# Patient Record
Sex: Female | Born: 1955
Health system: Southern US, Community
[De-identification: ages and names within clinical notes are randomized; demographics above are authoritative.]

## PROBLEM LIST (undated history)

## (undated) DIAGNOSIS — I10 Essential (primary) hypertension: Secondary | ICD-10-CM

## (undated) HISTORY — DX: Essential (primary) hypertension: I10

---

## 1998-01-06 ENCOUNTER — Other Ambulatory Visit: Admission: RE | Admit: 1998-01-06 | Discharge: 1998-01-06 | Payer: Self-pay | Admitting: Obstetrics and Gynecology

## 1998-02-03 ENCOUNTER — Ambulatory Visit (HOSPITAL_COMMUNITY): Admission: RE | Admit: 1998-02-03 | Discharge: 1998-02-03 | Payer: Self-pay | Admitting: Obstetrics and Gynecology

## 1998-04-16 ENCOUNTER — Ambulatory Visit (HOSPITAL_COMMUNITY): Admission: RE | Admit: 1998-04-16 | Discharge: 1998-04-16 | Payer: Self-pay | Admitting: Obstetrics and Gynecology

## 1999-02-10 ENCOUNTER — Other Ambulatory Visit: Admission: RE | Admit: 1999-02-10 | Discharge: 1999-02-10 | Payer: Self-pay | Admitting: Obstetrics and Gynecology

## 2000-01-07 ENCOUNTER — Other Ambulatory Visit: Admission: RE | Admit: 2000-01-07 | Discharge: 2000-01-07 | Payer: Self-pay | Admitting: Obstetrics and Gynecology

## 2001-01-16 ENCOUNTER — Other Ambulatory Visit: Admission: RE | Admit: 2001-01-16 | Discharge: 2001-01-16 | Payer: Self-pay | Admitting: Obstetrics and Gynecology

## 2003-01-21 ENCOUNTER — Other Ambulatory Visit: Admission: RE | Admit: 2003-01-21 | Discharge: 2003-01-21 | Payer: Self-pay | Admitting: Obstetrics and Gynecology

## 2004-10-26 ENCOUNTER — Encounter: Admission: RE | Admit: 2004-10-26 | Discharge: 2004-10-26 | Payer: Self-pay | Admitting: Family Medicine

## 2008-03-04 ENCOUNTER — Encounter: Admission: RE | Admit: 2008-03-04 | Discharge: 2008-03-04 | Payer: Self-pay | Admitting: Family Medicine

## 2010-01-08 ENCOUNTER — Encounter: Admission: RE | Admit: 2010-01-08 | Discharge: 2010-01-08 | Payer: Self-pay | Admitting: Gastroenterology

## 2010-01-21 ENCOUNTER — Ambulatory Visit (HOSPITAL_COMMUNITY): Admission: RE | Admit: 2010-01-21 | Discharge: 2010-01-21 | Payer: Self-pay | Admitting: Gastroenterology

## 2015-09-16 ENCOUNTER — Other Ambulatory Visit: Payer: Self-pay | Admitting: Family Medicine

## 2015-09-16 ENCOUNTER — Ambulatory Visit
Admission: RE | Admit: 2015-09-16 | Discharge: 2015-09-16 | Disposition: A | Discharge status: Home / Self Care | Payer: BLUE CROSS/BLUE SHIELD | Source: Ambulatory Visit | Attending: Family Medicine | Admitting: Family Medicine

## 2015-09-16 DIAGNOSIS — G459 Transient cerebral ischemic attack, unspecified: Secondary | ICD-10-CM

## 2015-09-16 MED ORDER — GADOBENATE DIMEGLUMINE 529 MG/ML IV SOLN
11.0000 mL | Freq: Once | INTRAVENOUS | Status: AC | PRN
  Administered 2015-09-16: 11 mL via INTRAVENOUS

## 2015-10-31 ENCOUNTER — Encounter: Payer: Self-pay | Admitting: Neurology

## 2015-10-31 ENCOUNTER — Ambulatory Visit (INDEPENDENT_AMBULATORY_CARE_PROVIDER_SITE_OTHER): Payer: BLUE CROSS/BLUE SHIELD | Admitting: Neurology

## 2015-10-31 VITALS — BP 124/86 | HR 81 | Ht 65.5 in | Wt 137.1 lb

## 2015-10-31 DIAGNOSIS — I674 Hypertensive encephalopathy: Secondary | ICD-10-CM

## 2015-10-31 NOTE — Progress Notes (Signed)
Chart forwarded.  

## 2015-10-31 NOTE — Progress Notes (Signed)
NEUROLOGY CONSULTATION NOTE  Adonis HugueninJung S Valone MRN: [id number] DOB: October 11, 1955  Referring provider: Dr. Paulino RilyWolters Primary care provider: Dr. Paulino RilyWolters  Reason for consult:  TIA  HISTORY OF PRESENT ILLNESS: Summer Smith is a 60 year old woman with hypertension, hypercholesterolemia, and GERD who presents for transient ischemic attack.  History obtained by patient and PCP note.  On 09/10/15, she was reading her emails when she felt confused.  She noticed that her emails were in the wrong folders.  She reread an email by her son from 3 days prior and it seemed different.  The email described the things that needed to be done to prepare starting medical school, such as immunizations and background checks.  The content was correct, but the dates were missing and the order of things to be done were different.  She had her husband read the email and he verified what she was reading.  She denied headache, slurred speech, inability to understand language, focal numbness or weakness.  She checked her blood pressure, which was 185/145.  She took an extra Diovan.  When she looked at the emails again, the emails were again in different folders.  The systolic blood pressure went down to 145.  The next morning, she logged onto her emails and everything was back to how she remembered it.  The emails were in the correct folders.  Her son's email was exactly how she remembered it prior to the night before.  Her blood pressure is typically 140-145/90-100.  The only thing she recalls that could have elevated her blood pressure was having eaten 2 spicy chicken sandwiches, which have high sodium content.  MRI of brain with and without contrast from 09/16/15 was personally reviewed and was overall unremarkable except for very mild atrophy.  Labs from May include Na 138, K 4, glucose 102, BUN 14, Cr 0.71, TSH 1.25, cholesterol 243, HDL 60 and LDL 164.  On about 3 occasions, she had brief dizziness.  One time, she reached up to grab  her cat who was stuck up on the cabinet.  When she got down, she had brief dizziness for a couple of seconds, like a sense of movement.  She had a couple of other similar events with no associated headache or lateralizing symptoms.  Her blood pressure has since been controlled.  She is exercising and watching what she eats.  PAST MEDICAL HISTORY: Past Medical History  Diagnosis Date  . Hypertension     PAST SURGICAL HISTORY: History reviewed. No pertinent past surgical history.  MEDICATIONS: Diovan  ALLERGIES: No Known Allergies  FAMILY HISTORY: Father  Hypertension  SOCIAL HISTORY: Social History   Social History  . Marital Status: Single    Spouse Name: N/A  . Number of Children: N/A  . Years of Education: N/A   Occupational History  . Not on file.   Social History Main Topics  . Smoking status: Never Smoker   . Smokeless tobacco: Not on file  . Alcohol Use: Not on file  . Drug Use: Not on file  . Sexual Activity: Not on file   Other Topics Concern  . Not on file   Social History Narrative  . No narrative on file    REVIEW OF SYSTEMS: Constitutional: No fevers, chills, or sweats, no generalized fatigue, change in appetite Eyes: No visual changes, double vision, eye pain Ear, nose and throat: No hearing loss, ear pain, nasal congestion, sore throat Cardiovascular: No chest pain, palpitations Respiratory:  No shortness of  breath at rest or with exertion, wheezes GastrointestinaI: No nausea, vomiting, diarrhea, abdominal pain, fecal incontinence Genitourinary:  No dysuria, urinary retention or frequency Musculoskeletal:  No neck pain, back pain Integumentary: No rash, pruritus, skin lesions Neurological: as above Psychiatric: No depression, insomnia, anxiety Endocrine: No palpitations, fatigue, diaphoresis, mood swings, change in appetite, change in weight, increased thirst Hematologic/Lymphatic:  No purpura, petechiae. Allergic/Immunologic: no itchy/runny  eyes, nasal congestion, recent allergic reactions, rashes  PHYSICAL EXAM: Filed Vitals:   10/31/15 0756  BP: 124/86  Pulse: 81   General: No acute distress.  Patient appears well-groomed.  Head:  Normocephalic/atraumatic Eyes:  fundi examined but not visualized Neck: supple, no paraspinal tenderness, full range of motion Back: No paraspinal tenderness Heart: regular rate and rhythm Lungs: Clear to auscultation bilaterally. Vascular: No carotid bruits. Neurological Exam: Mental status: alert and oriented to person, place, and time, recent and remote memory intact, fund of knowledge intact, attention and concentration intact, speech fluent and not dysarthric, language intact. Cranial nerves: CN I: not tested CN II: pupils equal, round and reactive to light, visual fields intact CN III, IV, VI:  full range of motion, no nystagmus, no ptosis CN V: facial sensation intact CN VII: upper and lower face symmetric CN VIII: hearing intact CN IX, X: gag intact, uvula midline CN XI: sternocleidomastoid and trapezius muscles intact CN XII: tongue midline Bulk & Tone: normal, no fasciculations. Motor:  5/5 throughout Sensation: temperature and vibration sensation intact. Deep Tendon Reflexes:  2+ throughout, toes downgoing.  Finger to nose testing:  Without dysmetria.  Heel to shin:  Without dysmetria.  Gait:  Normal station and stride.  Able to turn and tandem walk. Romberg negative.  IMPRESSION: I suspect hypertensive encephalopathy.  Her blood pressure may have been even higher at some point.  Symptoms are non-lateralizing, which make a diagnosis of TIA not definite.  Dizziness is nonspecific but I don't suspect cerebrovascular etiology  The MRI reveals maybe slight premature atrophy.  I don't believe it is clinically significant and may be just how her brain is.  PLAN: Continue BP control Follow up as needed.  Thank you for allowing me to take part in the care of this  patient.  Shon MilletAdam Karlina Suares, DO  CC:  Mila PalmerSharon Wolters, MD

## 2015-10-31 NOTE — Patient Instructions (Signed)
I think the confusion was related to the high blood pressure.  I don't think you had a TIA.  I would just continue making sure your blood pressure is controlled.  The MRI of brain looks okay to me.  Follow up as needed.

## 2016-04-22 DIAGNOSIS — I10 Essential (primary) hypertension: Secondary | ICD-10-CM | POA: Insufficient documentation

## 2017-12-22 IMAGING — MR MR HEAD WO/W CM
12 series · 45 of 48 positions shown · IV contrast (11ml Multihance)
Comparison: None.

CLINICAL DATA: Transient cerebral ischemia consisting of difficulty
reading. This lasted for 10-15 minutes.

EXAM:
MRI HEAD WITHOUT AND WITH CONTRAST
TECHNIQUE: Multiplanar, multiecho pulse sequences of the brain and surrounding
structures were obtained without and with intravenous contrast.
CONTRAST:  11mL MULTIHANCE GADOBENATE DIMEGLUMINE 529 MG/ML IV SOLN
Creatinine was obtained on site at [HOSPITAL] at [HOSPITAL].
Results: Creatinine 0.7 mg/dL.

[Series 2: t1_se_sag · sagittal · 5.0mm · 0.45mm/px · 1 of 21 slices shown]
[im 1/21]
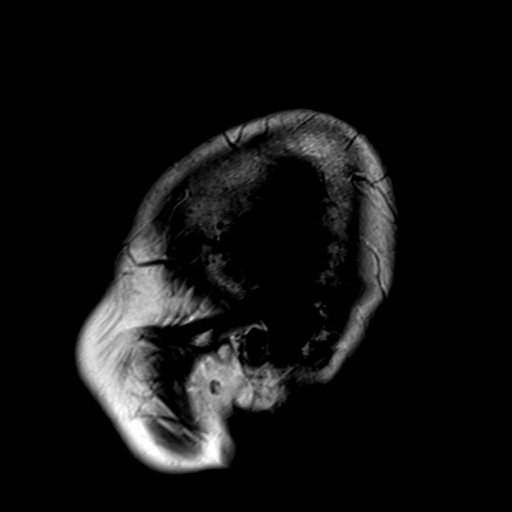

[Series 3: ep2d_diff_(id)_trace · axial · 3.0mm · 1.80mm/px · z∈[-56,+91]mm · 8 of 100 slices shown]
[im 1/100]
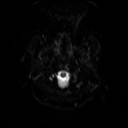
[im 15/100]
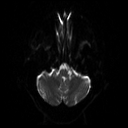
[im 29/100]
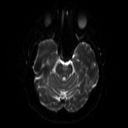
[im 43/100]
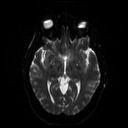
[im 57/100]
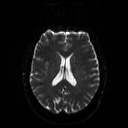
[im 71/100]
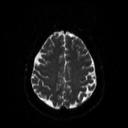
[im 85/100]
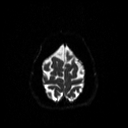
[im 100/100]
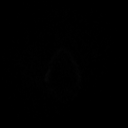

[Series 4: ep2d_diff_(id)_trace_adc · axial · 3.0mm · 1.80mm/px · z∈[-56,+91]mm · 4 of 50 slices shown]
[im 1/50]
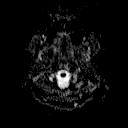
[im 17/50]
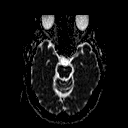
[im 33/50]
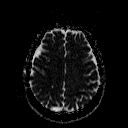
[im 50/50]
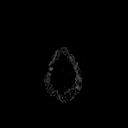

[Series 6: swi_images · axial · 2.0mm · 0.90mm/px · z∈[-55,+103]mm · 7 of 80 slices shown]
[im 1/80]
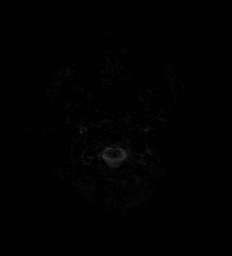
[im 14/80]
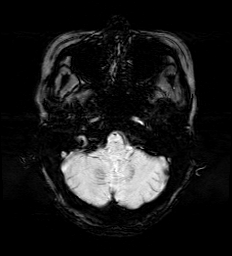
[im 27/80]
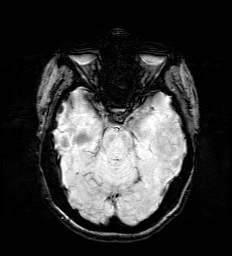
[im 40/80]
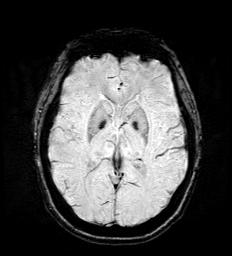
[im 53/80]
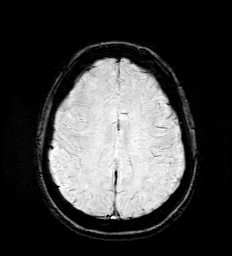
[im 66/80]
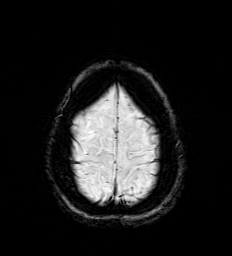
[im 80/80]
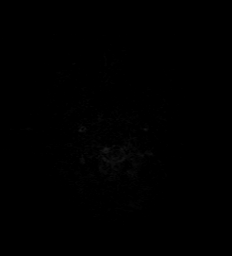

[Series 7: ep2d_diff_cor · coronal · 5.0mm · 1.77mm/px · 4 of 47 slices shown]
[im 1/47]
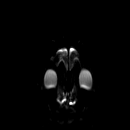
[im 16/47]
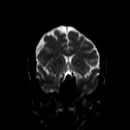
[im 31/47]
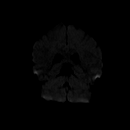
[im 47/47]
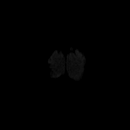

[Series 8: ep2d_diff_cor_adc · coronal · 5.0mm · 1.77mm/px · 2 of 24 slices shown]
[im 1/24]
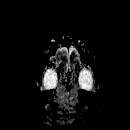
[im 24/24]
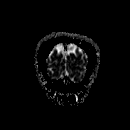

[Series 9: FLAIR · axial · 5.0mm · 0.45mm/px · z∈[-45,+93]mm · 2 of 23 slices shown]
[im 1/23]
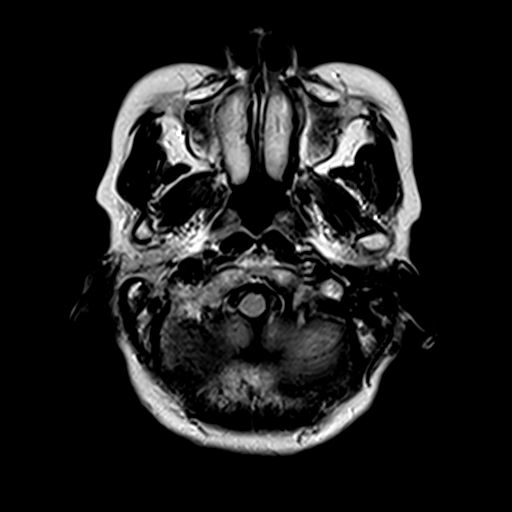
[im 23/23]
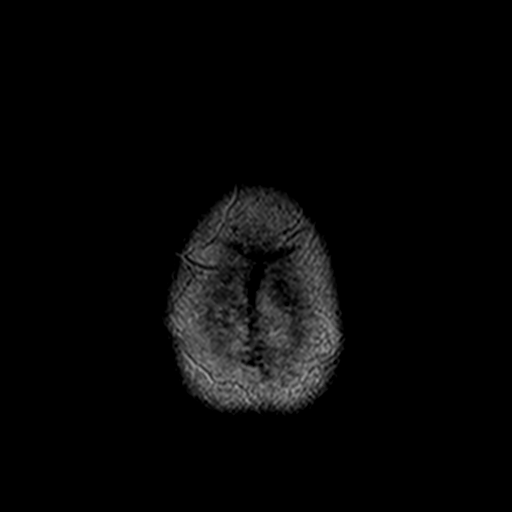

[Series 10: t2_tse_tra_512 · axial · 5.0mm · 0.60mm/px · z∈[-44,+93]mm · 2 of 23 slices shown]
[im 1/23]
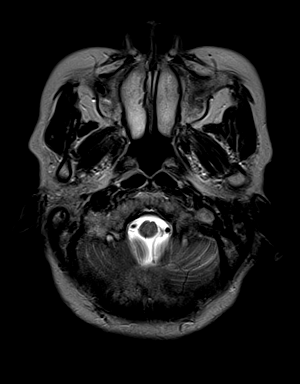
[im 23/23]
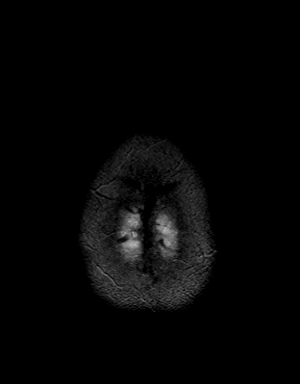

[Series 11: t1_mpr_tra · axial · 2.0mm · 0.45mm/px · z∈[-55,+103]mm · 7 of 80 slices shown]
[im 1/80]
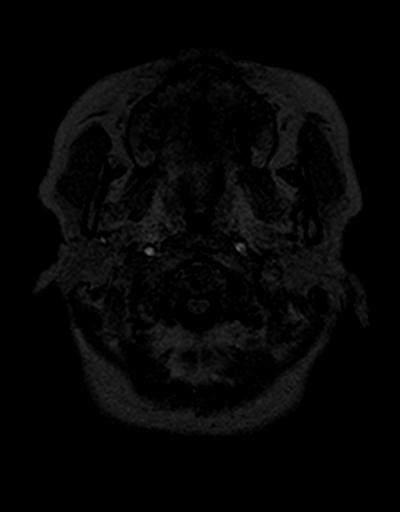
[im 14/80]
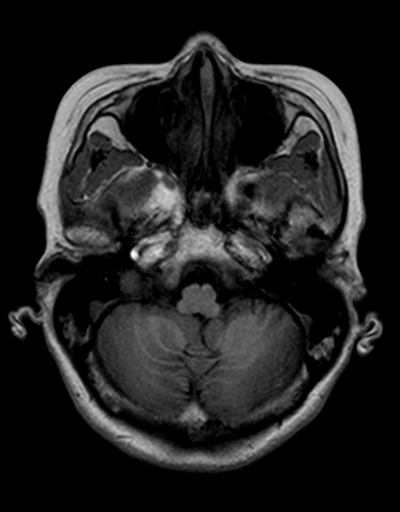
[im 27/80]
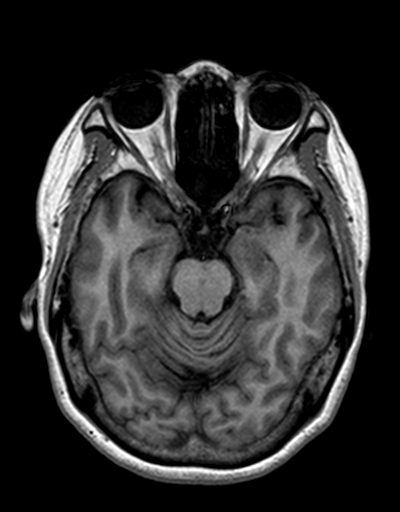
[im 40/80]
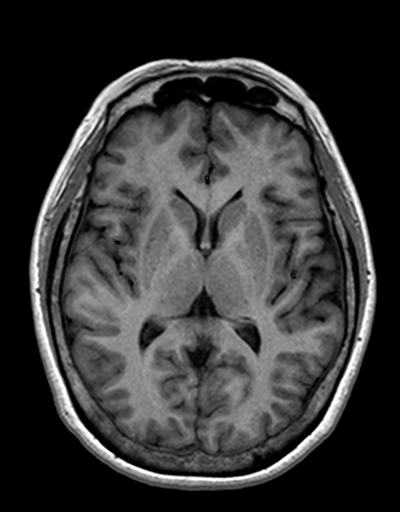
[im 53/80]
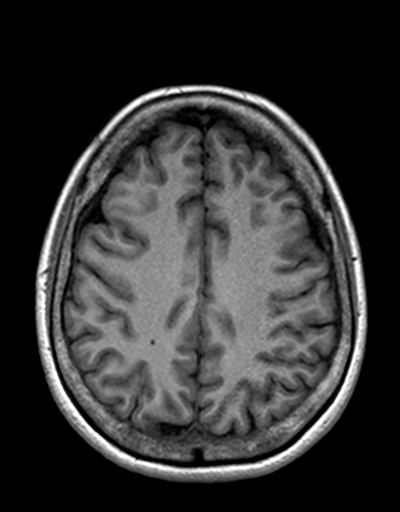
[im 66/80]
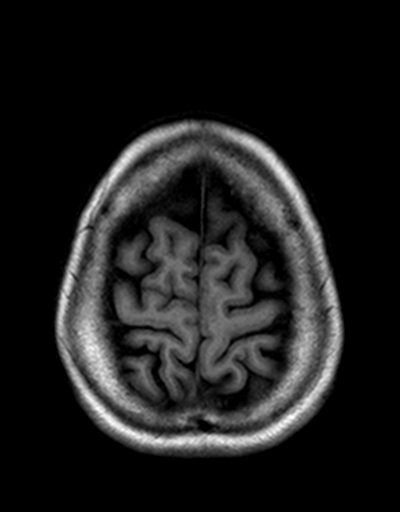
[im 80/80]
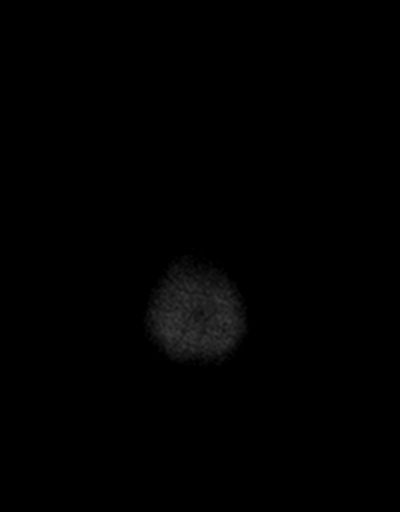

[Series 12: T2 · coronal · 5.0mm · 0.45mm/px · 2 of 25 slices shown]
[im 1/25]
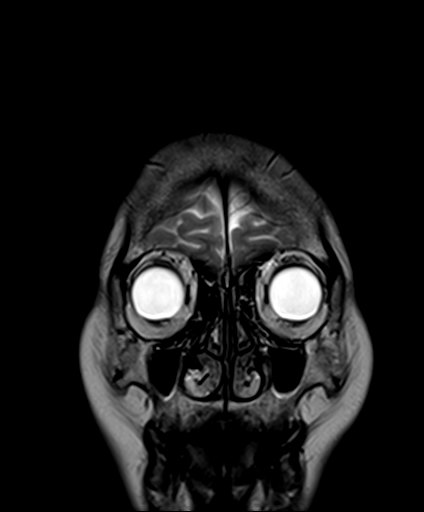
[im 25/25]
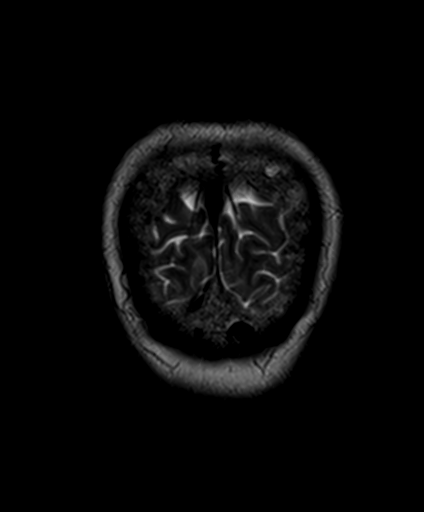

[Series 13: post cor · coronal · 5.0mm · 0.72mm/px · 2 of 25 slices shown]
[im 1/25]
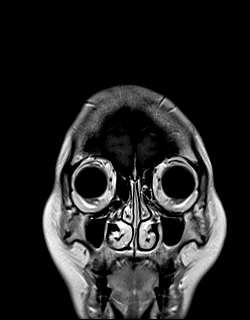
[im 25/25]
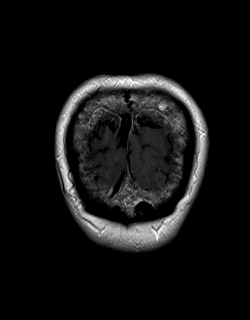

[Series 14: post t1_mpr_tra · axial · 2.0mm · 0.45mm/px · z∈[-59,+19]mm · 4 of 80 slices shown]
[im 1/80]
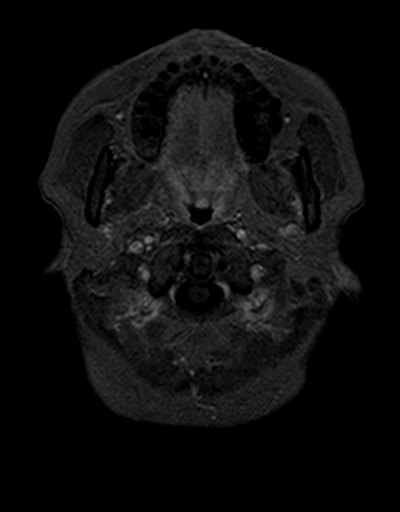
[im 14/80]
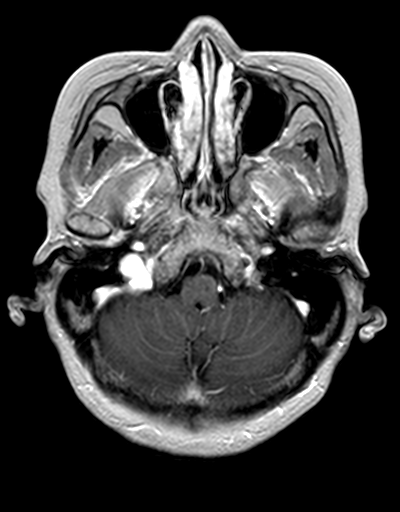
[im 27/80]
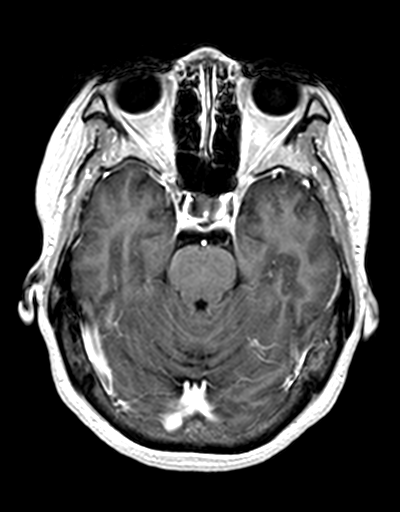
[im 40/80]
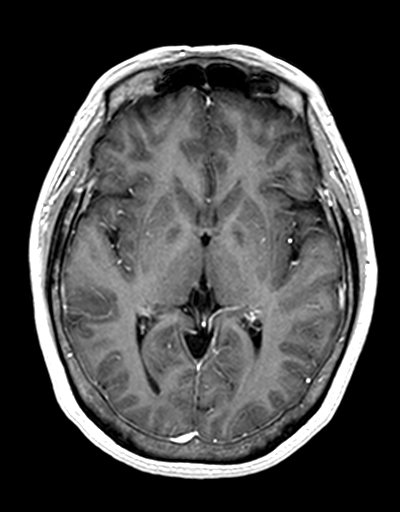

[45 of 48 positions shown; findings below may reference images not displayed]

FINDINGS: No evidence for acute infarction, hemorrhage, mass lesion,
hydrocephalus, or extra-axial fluid. There may be mild premature
atrophy. No significant white matter disease.

Flow voids are maintained throughout the carotid, basilar, and
vertebral arteries. There are no areas of chronic hemorrhage.

Pituitary, pineal, and cerebellar tonsils unremarkable. No upper
cervical lesions.

Post infusion, no abnormal enhancement of the brain or meninges.

Visualized calvarium, skull base, and upper cervical osseous
structures unremarkable. Scalp and extracranial soft tissues,
orbits, sinuses, and mastoids show no acute process.
IMPRESSION: Suspected mild premature atrophy.  No acute intracranial findings.

No abnormal postcontrast enhancement.

Specifically no features suggestive of acute or subacute cerebral
ischemia.

## 2019-02-01 DIAGNOSIS — Z23 Encounter for immunization: Secondary | ICD-10-CM | POA: Diagnosis not present

## 2019-02-01 DIAGNOSIS — Z0000 Encounter for general adult medical examination without abnormal findings: Secondary | ICD-10-CM | POA: Diagnosis not present

## 2019-02-01 DIAGNOSIS — I10 Essential (primary) hypertension: Secondary | ICD-10-CM | POA: Diagnosis not present

## 2019-04-09 DIAGNOSIS — Z20828 Contact with and (suspected) exposure to other viral communicable diseases: Secondary | ICD-10-CM | POA: Diagnosis not present

## 2019-05-01 ENCOUNTER — Telehealth: Payer: Self-pay

## 2019-05-01 NOTE — Telephone Encounter (Signed)
Patient called in stating that she has been referred to establish care with Dr. Birdie Riddle from [facility]. Also, her son is currently in medical school and has been told by multiple colleagues and professors great things about Dr. Birdie Riddle. Informed patient that Dr. Birdie Riddle has to approve any new patient appts. Please advise

## 2019-05-08 NOTE — Telephone Encounter (Signed)
Unfortunately I am not able to accept at this time due to current volumes

## 2019-05-08 NOTE — Telephone Encounter (Signed)
Patient called in for update on [facility] accepting her as a new patient.  I gave her the information and she gave multiple reasons why this is the perfect office for her.  I advised that I could set her up with E[facility] Aquas - she declined stating that she really wanted to be with Dr. Birdie Riddle. Again, I let her know that it was due to patient volume that she was not able to accept.  She said that she is not giving up that easily, and she will be writing a letter pleading her case with Dr. Birdie Riddle to take her on as a patient. Advised that I was not sure that it would work. She stated that she only lives 3 minutes down the road, her son is soon to be a Psychologist, sport and exercise and they have done a lot of research on [place] and really want her, so she is going to try.  Routing to provider so that she is aware.

## 2019-05-28 ENCOUNTER — Telehealth: Payer: Self-pay | Admitting: Internal Medicine

## 2019-05-28 NOTE — Telephone Encounter (Signed)
Patient has called about becoming new patient and was advised we were not accepting new patients at this time. Today she brought a hand written note to the office asking that I take her as a new patient. Letter sent to her we are not accepting new patients at this time.

## 2019-08-08 ENCOUNTER — Encounter: Payer: Self-pay | Admitting: Family Medicine

## 2019-11-02 ENCOUNTER — Ambulatory Visit: Payer: BLUE CROSS/BLUE SHIELD | Admitting: Family Medicine

## 2020-01-31 ENCOUNTER — Emergency Department (HOSPITAL_COMMUNITY)
Admission: EM | Admit: 2020-01-31 | Discharge: 2020-01-31 | Disposition: A | Discharge status: Home / Self Care | Payer: BC Managed Care – PPO | Attending: Emergency Medicine | Admitting: Emergency Medicine

## 2020-01-31 ENCOUNTER — Other Ambulatory Visit: Payer: Self-pay

## 2020-01-31 ENCOUNTER — Encounter (HOSPITAL_COMMUNITY): Payer: Self-pay

## 2020-01-31 DIAGNOSIS — Z79899 Other long term (current) drug therapy: Secondary | ICD-10-CM | POA: Diagnosis not present

## 2020-01-31 DIAGNOSIS — R040 Epistaxis: Secondary | ICD-10-CM

## 2020-01-31 DIAGNOSIS — I10 Essential (primary) hypertension: Secondary | ICD-10-CM | POA: Diagnosis not present

## 2020-01-31 MED ORDER — TRANEXAMIC ACID FOR EPISTAXIS
500.0000 mg | Freq: Once | TOPICAL | Status: DC
  Filled 2020-01-31: qty 10

## 2020-01-31 MED ORDER — OXYMETAZOLINE HCL 0.05 % NA SOLN
1.0000 | Freq: Once | NASAL | Status: AC
  Administered 2020-01-31: 1 via NASAL
  Filled 2020-01-31: qty 30

## 2020-01-31 MED ORDER — OXYMETAZOLINE HCL 0.05 % NA SOLN
2.0000 | Freq: Once | NASAL | Status: DC
  Filled 2020-01-31: qty 30

## 2020-01-31 NOTE — Discharge Instructions (Signed)
Recommend using Afrin spray for every day for the next 2 to 3 days, 2 sprays each nostril.  If you have recurrent nosebleed, apply direct pressure for 15 to 20 minutes.  If it is still bleeding, return to ER for further evaluation.  Recommend follow-up with your primary doctor regarding the nosebleed and your elevated blood pressure.  Discuss your long-term blood pressure regimen at that time.

## 2020-01-31 NOTE — ED Triage Notes (Signed)
Patient arrived stating that her nose has been bleeding for about 20 minutes. Declines any blood thinners or injury.

## 2020-02-02 NOTE — ED Provider Notes (Signed)
[facility]-EMERGENCY DEPT Provider Note   CSN: [id number] Arrival date & time: 01/31/20  1955     History Chief Complaint  Patient presents with  . Epistaxis    Summer Smith is a 64 y.o. female.  Presents to ER with concern for nosebleed.  Reports that she started having really bad nosebleed prior to arrival, lasting about 20 minutes, would not stop with direct pressure.  She denies prior history of nosebleeds, no history of anticoagulation.  Only medical problem is high blood pressure.  No recent changes in her blood pressure regimen.  HPI     Past Medical History:  Diagnosis Date  . Hypertension     There are no problems to display for this patient.   History reviewed. No pertinent surgical history.   OB History   No obstetric history on file.     Family History  Problem Relation Age of Onset  . Arthritis Mother   . Diabetes Mother   . Heart disease Father   . Hypertension Father   . Diabetes Sister   . Diabetes Brother     Social History   Tobacco Use  . Smoking status: Never Smoker  Substance Use Topics  . Alcohol use: Yes    Alcohol/week: 0.0 standard drinks  . Drug use: Not on file    Home Medications Prior to Admission medications   Medication Sig Start Date End Date Taking? Authorizing Provider  amLODipine (NORVASC) 5 MG tablet Take 5 mg by mouth daily.    [provider]  calcium carbonate (OSCAL) 1500 (600 Ca) MG TABS tablet Take by mouth every other day.    [provider]  DIOVAN HCT 80-12.5 MG tablet Take 1 tablet by mouth daily. 08/11/15   [provider]    Allergies    Patient has no known allergies.  Review of Systems   Review of Systems  Constitutional: Negative for chills and fever.  HENT: Positive for nosebleeds. Negative for ear pain and sore throat.   Eyes: Negative for pain and visual disturbance.  Respiratory: Negative for cough and shortness of breath.   Cardiovascular:  Negative for chest pain and palpitations.  Gastrointestinal: Negative for abdominal pain and vomiting.  Genitourinary: Negative for dysuria and hematuria.  Musculoskeletal: Negative for arthralgias and back pain.  Skin: Negative for color change and rash.  Neurological: Negative for seizures and syncope.  All other systems reviewed and are negative.   Physical Exam Updated Vital Signs BP (!) 135/102   Pulse 89   Temp 97.7 F (36.5 C) (Oral)   Resp 16   SpO2 99%   Physical Exam Vitals and nursing note reviewed.  Constitutional:      General: She is not in acute distress.    Appearance: She is well-developed.  HENT:     Head: Normocephalic and atraumatic.     Nose:     Comments: Small dried blood bilateral nostril Eyes:     Conjunctiva/sclera: Conjunctivae normal.  Cardiovascular:     Rate and Rhythm: Normal rate.     Pulses: Normal pulses.  Pulmonary:     Effort: Pulmonary effort is normal. No respiratory distress.  Musculoskeletal:        General: No deformity or signs of injury.     Cervical back: Neck supple.  Skin:    General: Skin is warm and dry.  Neurological:     General: No focal deficit present.     Mental Status: She is  alert.  Psychiatric:        Mood and Affect: Mood normal.        Behavior: Behavior normal.     ED Results / Procedures / Treatments   Labs (all labs ordered are listed, but only abnormal results are displayed) Labs Reviewed - No data to display  EKG None  Radiology No results found.  Procedures Procedures (including critical care time)  Medications Ordered in ED Medications  oxymetazoline (AFRIN) 0.05 % nasal spray 1 spray (1 spray Each Nare Given 01/31/20 2025)    ED Course  I have reviewed the triage vital signs and the nursing notes.  Pertinent labs & imaging results that were available during my care of the patient were reviewed by me and considered in my medical decision making (see chart for details).    MDM  Rules/Calculators/A&P                         64 year old lady with history of hypertension but no other medical problems presents to ER with nosebleed.  On arrival to ER, patient having nosebleed, provided Afrin spray and patient held direct pressure.  Originally had ordered TXA for topical application however prior to administration, her nosebleed stopped with simple direct pressure.  She was observed in ER and did not have recurrence of her nosebleed.  No prior history of easy bleeding, no other bleeding episodes, no petechiae or ecchymosis noted on skin.  Initially hypertensive but improved later without treatment.  Recommended patient follow-up with her primary doctor.    After the discussed management above, the patient was determined to be safe for discharge.  The patient was in agreement with this plan and all questions regarding their care were answered.  ED return precautions were discussed and the patient will return to the ED with any significant worsening of condition.    Final Clinical Impression(s) / ED Diagnoses Final diagnoses:  Epistaxis  Hypertension, unspecified type    Rx / DC Orders ED Discharge Orders    None       Milagros Loll, MD 02/02/20 [phone]

## 2020-02-07 DIAGNOSIS — Z23 Encounter for immunization: Secondary | ICD-10-CM | POA: Diagnosis not present

## 2020-02-07 DIAGNOSIS — I491 Atrial premature depolarization: Secondary | ICD-10-CM | POA: Insufficient documentation

## 2020-02-07 DIAGNOSIS — R040 Epistaxis: Secondary | ICD-10-CM | POA: Diagnosis not present

## 2020-02-07 DIAGNOSIS — Z6822 Body mass index (BMI) 22.0-22.9, adult: Secondary | ICD-10-CM | POA: Diagnosis not present

## 2020-02-07 DIAGNOSIS — R002 Palpitations: Secondary | ICD-10-CM | POA: Diagnosis not present

## 2020-02-07 DIAGNOSIS — I10 Essential (primary) hypertension: Secondary | ICD-10-CM | POA: Diagnosis not present

## 2020-02-14 DIAGNOSIS — R002 Palpitations: Secondary | ICD-10-CM | POA: Diagnosis not present

## 2020-03-06 DIAGNOSIS — R002 Palpitations: Secondary | ICD-10-CM | POA: Diagnosis not present

## 2020-03-07 ENCOUNTER — Encounter: Payer: Self-pay | Admitting: Family Medicine

## 2020-03-07 ENCOUNTER — Other Ambulatory Visit: Payer: Self-pay

## 2020-03-07 ENCOUNTER — Ambulatory Visit (INDEPENDENT_AMBULATORY_CARE_PROVIDER_SITE_OTHER): Payer: BC Managed Care – PPO | Admitting: Family Medicine

## 2020-03-07 VITALS — BP 142/88 | HR 75 | Temp 97.2°F | Ht 65.0 in | Wt 139.0 lb

## 2020-03-07 DIAGNOSIS — E782 Mixed hyperlipidemia: Secondary | ICD-10-CM | POA: Insufficient documentation

## 2020-03-07 DIAGNOSIS — I10 Essential (primary) hypertension: Secondary | ICD-10-CM | POA: Diagnosis not present

## 2020-03-07 DIAGNOSIS — Z86010 Personal history of colonic polyps: Secondary | ICD-10-CM

## 2020-03-07 DIAGNOSIS — E785 Hyperlipidemia, unspecified: Secondary | ICD-10-CM | POA: Diagnosis not present

## 2020-03-07 DIAGNOSIS — I491 Atrial premature depolarization: Secondary | ICD-10-CM | POA: Diagnosis not present

## 2020-03-07 HISTORY — DX: Hyperlipidemia, unspecified: E78.5

## 2020-03-07 MED ORDER — AMLODIPINE BESYLATE 10 MG PO TABS: 10.0000 mg | ORAL_TABLET | Freq: Every day | ORAL | 3 refills | Status: DC

## 2020-03-07 NOTE — Progress Notes (Signed)
Subjective  CC:  Chief Complaint  Patient presents with   New Patient (Initial Visit)    previously seen by Dr. Vallery Ridge in [place]    HPI: Summer Smith is a 64 y.o. female who presents to [facility] [place] [facility] today to establish care with me as a new patient.  Reviewed past medical records from previous primary care provider.  Reviewed recent echocardiogram results, EKG results.  Recent lab work done in September.  TSH, CBC, CMP.  Elevated cholesterol with an LDL of 178 noted. She has the following concerns or needs:  Very pleasant, smart successful 89 year old married mother of 2.  Currently working for the state.  Overall lives a very healthy lifestyle with regular exercise and healthy diet.  Has chronic hypertension since her 82s.  Has been well controlled although home readings tend to run 130s to high 80s to 90.  No known heart disease.  No diabetes.  Recent cholesterol was mildly elevated.  This is never treated.  Strong family history of hypertension diabetes but no early heart disease stroke or cancer.  She feels well.  Of note however over the last several weeks she is at increased palpitation and is undergoing work-up for that.  EKG showed PACs but echocardiogram was normal.  She currently is on a Zio patch.  Health maintenance is up-to-date.  Mammogram scheduled.  Pap smear is up-to-date.  Remote history of colon polyp.  Immunizations are up-to-date.   Assessment  1. Essential hypertension   2. PAC (premature atrial contraction)   3. Dyslipidemia   4. History of colon polyps      Plan   Essential hypertension with borderline control.  Perhaps mildly elevated today because she is "excited".  Recommend increasing amlodipine to 10 mg daily.  Continue Diovan HCT 100/12.5.  Recheck 3 months.  Continue low-sodium healthy diet.  Continue regular  Work-up for palpitations ongoing.  Likely stress related.  Reassured.  She has cut down on her workload.  This  should help.  Discussed cholesterol readings.  Will recheck in 3 to 4 months.  Start statin indicated.  Educated some today.  Health maintenance is currently up-to-date.  Mammogram to be done soon.  We discussed having 1 more Pap smear at next visit.  If normal, then can stop  Follow up: 3 to 4 months complete physical and follow-up blood pressure No orders of the defined types were placed in this encounter.  Meds ordered this encounter  Medications   amLODipine (NORVASC) 10 MG tablet    Sig: Take 1 tablet (10 mg total) by mouth daily.    Dispense:  90 tablet    Refill:  3     Depression screen PHQ 2/9 03/07/2020  Decreased Interest 0  Down, Depressed, Hopeless 0  PHQ - 2 Score 0    We updated and reviewed the patient's past history in detail and it is documented below.  Patient Active Problem List   Diagnosis Date Noted   Dyslipidemia 03/07/2020   History of colon polyps 03/07/2020   PAC (premature atrial contraction) 02/07/2020    01/2020: nl Echocardiogram. EKG with PACs. zio patch pending. Nl labs.? Stress induced.    Essential hypertension 04/22/2016   Health Maintenance  Topic Date Due   MAMMOGRAM  04/07/2020 (Originally 11/21/1973)   PAP SMEAR-Modifier  03/07/2022   COLONOSCOPY  03/08/2023   TETANUS/TDAP  09/29/2027   INFLUENZA VACCINE  Completed   COVID-19 Vaccine  Completed  Hepatitis C Screening  Completed   HIV Screening  Discontinued   Immunization History  Administered Date(s) Administered   Influenza,inj,Quad PF,6+ Mos 02/06/2017, 02/01/2019, 02/07/2020   Influenza-Unspecified 02/16/2016, 03/01/2018   PFIZER SARS-COV-2 Vaccination 07/25/2019, 08/15/2019   Tdap 09/28/2017   Current Meds  Medication Sig   amLODipine (NORVASC) 10 MG tablet Take 1 tablet (10 mg total) by mouth daily.   calcium carbonate (OSCAL) 1500 (600 Ca) MG TABS tablet Take by mouth every other day.   valsartan-hydrochlorothiazide (DIOVAN-HCT) 160-25 MG tablet  Take 1 tablet by mouth daily.   [DISCONTINUED] amLODipine (NORVASC) 5 MG tablet Take 5 mg by mouth daily.   [DISCONTINUED] DIOVAN HCT 80-12.5 MG tablet Take 1 tablet by mouth daily.    Allergies: Patient has No Known Allergies. Past Medical History Patient  has a past medical history of Dyslipidemia (03/07/2020) and Hypertension. Past Surgical History Patient  has no past surgical history on file. Family History: Patient family history includes Arthritis in her mother; Diabetes in her brother, mother, and sister; Heart disease in her father; Hypertension in her father, paternal grandfather, and paternal grandmother. Social History:  Patient  reports that she has never smoked. She has never used smokeless tobacco. She reports current alcohol use. She reports that she does not use drugs.  Review of Systems: Constitutional: negative for fever or malaise Ophthalmic: negative for photophobia, double vision or loss of vision Cardiovascular: negative for chest pain, dyspnea on exertion, or new LE swelling Respiratory: negative for SOB or persistent cough Gastrointestinal: negative for abdominal pain, change in bowel habits or melena Genitourinary: negative for dysuria or gross hematuria Musculoskeletal: negative for new gait disturbance or muscular weakness Integumentary: negative for new or persistent rashes Neurological: negative for TIA or stroke symptoms Psychiatric: negative for SI or delusions Allergic/Immunologic: negative for hives  Patient Care Team    Relationship Specialty Notifications Start End  Willow Ora, MD PCP - General Family Medicine  03/07/20   Bernette Redbird, MD Consulting Physician Gastroenterology  08/08/19     Objective  Vitals: BP (!) 142/88    Pulse 75    Temp (!) 97.2 F (36.2 C) (Temporal)    Ht 5\' 5"  (1.651 m)    Wt 139 lb (63 kg)    SpO2 97%    BMI 23.13 kg/m  General:  Well developed, well nourished, no acute distress  Psych:  Alert and  oriented,normal mood and affect HEENT:  Normocephalic, atraumatic, non-icteric sclera, supple neck without adenopathy, mass or thyromegaly Cardiovascular:  RRR without gallop, rub or murmur Respiratory:  Good breath sounds bilaterally, CTAB with normal respiratory effort MSK: no deformities, contusions. Joints are without erythema or swelling Skin:  Warm, no rashes or suspicious lesions noted Neurologic:    Mental status is normal. Gross motor and sensory exams are normal. Normal gait     Commons side effects, risks, benefits, and alternatives for medications and treatment plan prescribed today were discussed, and the patient expressed understanding of the given instructions. Patient is instructed to call or message via MyChart if he/she has any questions or concerns regarding our treatment plan. No barriers to understanding were identified. We discussed Red Flag symptoms and signs in detail. Patient expressed understanding regarding what to do in case of urgent or emergency type symptoms.   Medication list was reconciled, printed and provided to the patient in AVS. Patient instructions and summary information was reviewed with the patient as documented in the AVS. This note was prepared with assistance  of Conservation officer, historic buildings. Occasional wrong-word or sound-a-like substitutions may have occurred due to the inherent limitations of voice recognition software  This visit occurred during the SARS-CoV-2 public health emergency.  Safety protocols were in place, including screening questions prior to the visit, additional usage of staff PPE, and extensive cleaning of exam room while observing appropriate contact time as indicated for disinfecting solutions.

## 2020-03-07 NOTE — Patient Instructions (Addendum)
Please return in 3-4 months to recheck blood pressure and CPE with pap.   I have increased your amlodipine dose to 10mg  daily. Continue your diovan hct 160-25 daily as well.  Please sign up for Mychart. I have texted you the link.  It was a pleasure meeting you today! Thank you for choosing to meet your healthcare needs! I truly look forward to working with you. If you have any questions or concerns, please send me a message via Mychart or call the office at [phone].

## 2020-03-11 ENCOUNTER — Encounter: Payer: Self-pay | Admitting: Family Medicine

## 2020-03-24 DIAGNOSIS — Z1231 Encounter for screening mammogram for malignant neoplasm of breast: Secondary | ICD-10-CM | POA: Diagnosis not present

## 2020-03-24 LAB — HM MAMMOGRAPHY

## 2020-03-25 ENCOUNTER — Encounter: Payer: Self-pay | Admitting: Family Medicine

## 2020-03-31 DIAGNOSIS — R002 Palpitations: Secondary | ICD-10-CM | POA: Diagnosis not present

## 2020-06-26 ENCOUNTER — Encounter: Payer: Self-pay | Admitting: Family Medicine

## 2020-06-26 ENCOUNTER — Other Ambulatory Visit: Payer: Self-pay

## 2020-06-26 ENCOUNTER — Ambulatory Visit (INDEPENDENT_AMBULATORY_CARE_PROVIDER_SITE_OTHER): Payer: BC Managed Care – PPO | Admitting: Family Medicine

## 2020-06-26 ENCOUNTER — Other Ambulatory Visit (HOSPITAL_COMMUNITY)
Admission: RE | Admit: 2020-06-26 | Discharge: 2020-06-26 | Disposition: A | Discharge status: Home / Self Care | Payer: BC Managed Care – PPO | Source: Ambulatory Visit | Attending: Family Medicine | Admitting: Family Medicine

## 2020-06-26 VITALS — BP 118/82 | HR 77 | Temp 98.0°F | Resp 16 | Wt 138.2 lb

## 2020-06-26 DIAGNOSIS — Z86010 Personal history of colonic polyps: Secondary | ICD-10-CM | POA: Diagnosis not present

## 2020-06-26 DIAGNOSIS — I491 Atrial premature depolarization: Secondary | ICD-10-CM | POA: Diagnosis not present

## 2020-06-26 DIAGNOSIS — Z23 Encounter for immunization: Secondary | ICD-10-CM

## 2020-06-26 DIAGNOSIS — Z124 Encounter for screening for malignant neoplasm of cervix: Secondary | ICD-10-CM

## 2020-06-26 DIAGNOSIS — Z0000 Encounter for general adult medical examination without abnormal findings: Secondary | ICD-10-CM | POA: Insufficient documentation

## 2020-06-26 DIAGNOSIS — E785 Hyperlipidemia, unspecified: Secondary | ICD-10-CM | POA: Diagnosis not present

## 2020-06-26 DIAGNOSIS — Z7185 Encounter for immunization safety counseling: Secondary | ICD-10-CM

## 2020-06-26 LAB — COMPREHENSIVE METABOLIC PANEL
ALT: 14 U/L (ref 0–35)
AST: 15 U/L (ref 0–37)
Albumin: 4.5 g/dL (ref 3.5–5.2)
Alkaline Phosphatase: 55 U/L (ref 39–117)
BUN: 13 mg/dL (ref 6–23)
CO2: 25 mEq/L (ref 19–32)
Calcium: 9.6 mg/dL (ref 8.4–10.5)
Chloride: 104 mEq/L (ref 96–112)
Creatinine, Ser: 0.78 mg/dL (ref 0.40–1.20)
GFR: 80.73 mL/min (ref >60.00)
Glucose, Bld: 104 mg/dL — ABNORMAL HIGH (ref 70–99)
Potassium: 3.6 mEq/L (ref 3.5–5.1)
Sodium: 138 mEq/L (ref 135–145)
Total Bilirubin: 0.7 mg/dL (ref 0.2–1.2)
Total Protein: 7.7 g/dL (ref 6.0–8.3)

## 2020-06-26 LAB — LIPID PANEL
Cholesterol: 232 mg/dL — ABNORMAL HIGH (ref 0–200)
HDL: 66.2 mg/dL (ref >39.00)
LDL Cholesterol: 146 mg/dL — ABNORMAL HIGH (ref 0–99)
NonHDL: 165.72
Total CHOL/HDL Ratio: 4
Triglycerides: 99 mg/dL (ref 0.0–149.0)
VLDL: 19.8 mg/dL (ref 0.0–40.0)

## 2020-06-26 LAB — CBC WITH DIFFERENTIAL/PLATELET
Basophils Absolute: 0 10*3/uL (ref 0.0–0.1)
Basophils Relative: 0.4 % (ref 0.0–3.0)
Eosinophils Absolute: 0.1 10*3/uL (ref 0.0–0.7)
Eosinophils Relative: 1 % (ref 0.0–5.0)
HCT: 40.5 % (ref 36.0–46.0)
Hemoglobin: 13.9 g/dL (ref 12.0–15.0)
Lymphocytes Relative: 39.8 % (ref 12.0–46.0)
Lymphs Abs: 2.6 10*3/uL (ref 0.7–4.0)
MCHC: 34.4 g/dL (ref 30.0–36.0)
MCV: 93.4 fl (ref 78.0–100.0)
Monocytes Absolute: 0.3 10*3/uL (ref 0.1–1.0)
Monocytes Relative: 4.3 % (ref 3.0–12.0)
Neutro Abs: 3.6 10*3/uL (ref 1.4–7.7)
Neutrophils Relative %: 54.5 % (ref 43.0–77.0)
Platelets: 266 10*3/uL (ref 150.0–400.0)
RBC: 4.34 Mil/uL (ref 3.87–5.11)
RDW: 12.8 % (ref 11.5–15.5)
WBC: 6.6 10*3/uL (ref 4.0–10.5)

## 2020-06-26 LAB — TSH: TSH: 1.14 u[IU]/mL (ref 0.35–4.50)

## 2020-06-26 NOTE — Progress Notes (Signed)
Subjective  Chief Complaint  Patient presents with  . Annual Exam    Fasting   . Hypertension    Added amlodipine 10 mg last visit, states at home BP is 130/80's   . Gynecologic Exam    HPI: Summer Smith is a 65 y.o. female who presents to [facility] today for a Female Wellness Visit. She also has the concerns and/or needs as listed above in the chief complaint. These will be addressed in addition to the Health Maintenance Visit.   Wellness Visit: annual visit with health maintenance review and exam with Pap   HM: due pap smear. Other screens are reviewed and up to date. Eligible for shingrix and covid booster. Doing well.  Chronic disease f/u and/or acute problem visit: (deemed necessary to be done in addition to the wellness visit):  HTN: Blood pressure is better controlled.  Patient checks at home runs 130s over 80s consistently.  She feels well on her medicine.  She does require brand-name Diovan HCT.  Generic valsartan gives her palpitations.  She has been on this for decades.  She is never been on a beta-blocker.  She is on amlodipine and tolerating that well without lower extremity edema.  She has no chest pain or shortness of breath.  Dyslpidemia: Fasting for recheck.  Eats a healthy diet.  PACs: Had work-up by cardiology including EKG, Zio patch monitor and echocardiogram.  Zio patch showed PACs.  Work-up was otherwise unremarkable.  No worrisome signs or symptoms of ischemia.  She still feels them they are not too terribly bothersome.  She has never been tried on a beta-blocker.  Of note, the started after she contracted Covid.  History of adenomatous colon last colonoscopy, 7 years ago was normal.  She has 1 scheduled with Dr. Matthias Hughs next several months.  No symptoms.  Assessment  1. Annual physical exam   2. Cervical cancer screening   3. Dyslipidemia   4. PAC (premature atrial contraction)   5. History of colon polyps   6. Vaccine counseling       Plan  Female Wellness Visit:  Age appropriate Health Maintenance and Prevention measures were discussed with patient. Included topics are cancer screening recommendations, ways to keep healthy (see AVS) including dietary and exercise recommendations, regular eye and dental care, use of seat belts, and avoidance of moderate alcohol use and tobacco use.  Pap smear with HPV testing today.  If negative, should be her last one.  Mammogram is up-to-date.  Colonoscopy scheduled  BMI: discussed patient's BMI and encouraged positive lifestyle modifications to help get to or maintain a target BMI.  HM needs and immunizations were addressed and ordered. See below for orders. See HM and immunization section for updates.  Education counseling on risks and benefits and indications for use Shingrix.  For Shingrix given today.  Second to be given in 6 months  Routine labs and screening tests ordered including cmp, cbc and lipids where appropriate.  Discussed recommendations regarding Vit D and calcium supplementation (see AVS)  Chronic disease management visit and/or acute problem visit:  Hypertension: Well-controlled.  Continue Diovan HCT 180/25 and amlodipine 10 mg.  Recheck renal function electrolytes today.  Dyslipidemia: Recheck lipid panel, fasting.  Check for indication of on statin.  Continue healthy lifestyle and low-fat diet.  Premature atrial contractions with symptomatic palpitations, continue to monitor.  Negative cardiology evaluation.  Trial of beta-blocker if needed.  Education given.  History of polyps: Await  colonoscopy.  Hopefully will be normal and can return to every 10 years screening.  Follow up: 6 months for hypertension recheck and second Shingrix Orders Placed This Encounter  Procedures  . Varicella-zoster vaccine IM (Shingrix)  . CBC with Differential/Platelet  . Comprehensive metabolic panel  . Lipid panel  . TSH   No orders of the defined types were placed in this  encounter.     Body mass index is 23 kg/m. Wt Readings from Last 3 Encounters:  06/26/20 138 lb 3.2 oz (62.7 kg)  03/07/20 139 lb (63 kg)  10/31/15 137 lb 1.6 oz (62.2 kg)     Patient Active Problem List   Diagnosis Date Noted  . Dyslipidemia 03/07/2020  . History of colon polyps 03/07/2020  . PAC (premature atrial contraction) 02/07/2020    01/2020: nl Echocardiogram. EKG with PACs. zio patch pending. Nl labs.? Stress induced.   . Essential hypertension 04/22/2016   Health Maintenance  Topic Date Due  . COVID-19 Vaccine (3 - Booster for Pfizer series) 02/14/2020  . MAMMOGRAM  03/24/2021  . PAP SMEAR-Modifier  03/07/2022  . COLONOSCOPY (Pts 45-71yrs Insurance coverage will need to be confirmed)  03/08/2023  . TETANUS/TDAP  09/29/2027  . INFLUENZA VACCINE  Completed  . Hepatitis C Screening  Completed  . HIV Screening  Discontinued   Immunization History  Administered Date(s) Administered  . Influenza,inj,Quad PF,6+ Mos 02/06/2017, 02/01/2019, 02/07/2020  . Influenza-Unspecified 02/16/2016, 03/01/2018  . PFIZER(Purple Top)SARS-COV-2 Vaccination 07/25/2019, 08/15/2019  . Td 03/10/2005  . Tdap 12/30/2010, 09/28/2017  . Zoster Recombinat (Shingrix) 06/26/2020   We updated and reviewed the patient's past history in detail and it is documented below. Allergies: Patient is allergic to valsartan. Past Medical History Patient  has a past medical history of Dyslipidemia (03/07/2020) and Hypertension. Past Surgical History Patient  has no past surgical history on file. Family History: Patient family history includes Arthritis in her mother; Diabetes in her brother, mother, and sister; Heart disease in her father; Hypertension in her father, paternal grandfather, and paternal grandmother. Social History:  Patient  reports that she has never smoked. She has never used smokeless tobacco. She reports current alcohol use. She reports that she does not use drugs.  Review of  Systems: Constitutional: negative for fever or malaise Ophthalmic: negative for photophobia, double vision or loss of vision Cardiovascular: negative for chest pain, dyspnea on exertion, or new LE swelling Respiratory: negative for SOB or persistent cough Gastrointestinal: negative for abdominal pain, change in bowel habits or melena Genitourinary: negative for dysuria or gross hematuria, no abnormal uterine bleeding or disharge Musculoskeletal: negative for new gait disturbance or muscular weakness Integumentary: negative for new or persistent rashes, no breast lumps Neurological: negative for TIA or stroke symptoms Psychiatric: negative for SI or delusions Allergic/Immunologic: negative for hives  Patient Care Team    Relationship Specialty Notifications Start End  Willow Ora, MD PCP - General Family Medicine  03/07/20   Bernette Redbird, MD Consulting Physician Gastroenterology  08/08/19     Objective  Vitals: BP 118/82   Pulse 77   Temp 98 F (36.7 C) (Temporal)   Resp 16   Wt 138 lb 3.2 oz (62.7 kg)   SpO2 97%   BMI 23.00 kg/m  General:  Well developed, well nourished, no acute distress  Psych:  Alert and orientedx3,normal mood and affect HEENT:  Normocephalic, atraumatic, non-icteric sclera,  supple neck without adenopathy, mass or thyromegaly Cardiovascular:  Normal S1, S2, RRR without gallop, rub  or murmur Respiratory:  Good breath sounds bilaterally, CTAB with normal respiratory effort Gastrointestinal: normal bowel sounds, soft, non-tender, no noted masses. No HSM MSK: no deformities, contusions. Joints are without erythema or swelling.  Skin:  Warm, no rashes or suspicious lesions noted Neurologic:    Mental status is normal. CN 2-11 are normal. Gross motor and sensory exams are normal. Normal gait. No tremor Breast Exam: No mass, skin retraction or nipple discharge is appreciated in either breast. No axillary adenopathy. Fibrocystic changes are not noted Pelvic  Exam: Normal external genitalia, no vulvar or vaginal lesions present.  Atrophic vaginal mucosa.  Clear cervix w/o CMT. Bimanual exam reveals a nontender fundus w/o masses, nl size. No adnexal masses present. No inguinal adenopathy. A PAP smear was performed.    Commons side effects, risks, benefits, and alternatives for medications and treatment plan prescribed today were discussed, and the patient expressed understanding of the given instructions. Patient is instructed to call or message via MyChart if he/she has any questions or concerns regarding our treatment plan. No barriers to understanding were identified. We discussed Red Flag symptoms and signs in detail. Patient expressed understanding regarding what to do in case of urgent or emergency type symptoms.   Medication list was reconciled, printed and provided to the patient in AVS. Patient instructions and summary information was reviewed with the patient as documented in the AVS. This note was prepared with assistance of Dragon voice recognition software. Occasional wrong-word or sound-a-like substitutions may have occurred due to the inherent limitations of voice recognition software  This visit occurred during the SARS-CoV-2 public health emergency.  Safety protocols were in place, including screening questions prior to the visit, additional usage of staff PPE, and extensive cleaning of exam room while observing appropriate contact time as indicated for disinfecting solutions.

## 2020-06-26 NOTE — Patient Instructions (Signed)
Please return in 6 months for hypertension follow up.  I will release your lab results to you on your MyChart account with further instructions. Please reply with any questions.  Today you were given your 1st of two Shingrix vaccinations.  This help protect your from getting shingles.   If you have any questions or concerns, please don't hesitate to send me a message via MyChart or call the office at [phone]. Thank you for visiting with Summer Smith today! It's our pleasure caring for you.   Preventive Care 42-70 Years Old, Female Preventive care refers to lifestyle choices and visits with your health care provider that can promote health and wellness. This includes:  A yearly physical exam. This is also called an annual wellness visit.  Regular dental and eye exams.  Immunizations.  Screening for certain conditions.  Healthy lifestyle choices, such as: ? Eating a healthy diet. ? Getting regular exercise. ? Not using drugs or products that contain nicotine and tobacco. ? Limiting alcohol use. What can I expect for my preventive care visit? Physical exam Your health care provider will check your:  Height and weight. These may be used to calculate your BMI (body mass index). BMI is a measurement that tells if you are at a healthy weight.  Heart rate and blood pressure.  Body temperature.  Skin for abnormal spots. Counseling Your health care provider may ask you questions about your:  Past medical problems.  Family's medical history.  Alcohol, tobacco, and drug use.  Emotional well-being.  Home life and relationship well-being.  Sexual activity.  Diet, exercise, and sleep habits.  Work and work Statistician.  Access to firearms.  Method of birth control.  Menstrual cycle.  Pregnancy history. What immunizations do I need? Vaccines are usually given at various ages, according to a schedule. Your health care provider will recommend vaccines for you based on your age,  medical history, and lifestyle or other factors, such as travel or where you work.   What tests do I need? Blood tests  Lipid and cholesterol levels. These may be checked every 5 years, or more often if you are over 43 years old.  Hepatitis C test.  Hepatitis B test. Screening  Lung cancer screening. You may have this screening every year starting at age 20 if you have a 30-pack-year history of smoking and currently smoke or have quit within the past 15 years.  Colorectal cancer screening. ? All adults should have this screening starting at age 91 and continuing until age 41. ? Your health care provider may recommend screening at age 19 if you are at increased risk. ? You will have tests every 1-10 years, depending on your results and the type of screening test.  Diabetes screening. ? This is done by checking your blood sugar (glucose) after you have not eaten for a while (fasting). ? You may have this done every 1-3 years.  Mammogram. ? This may be done every 1-2 years. ? Talk with your health care provider about when you should start having regular mammograms. This may depend on whether you have a family history of breast cancer.  BRCA-related cancer screening. This may be done if you have a family history of breast, ovarian, tubal, or peritoneal cancers.  Pelvic exam and Pap test. ? This may be done every 3 years starting at age 63. ? Starting at age 39, this may be done every 5 years if you have a Pap test in combination with an HPV test.  Other tests  STD (sexually transmitted disease) testing, if you are at risk.  Bone density scan. This is done to screen for osteoporosis. You may have this scan if you are at high risk for osteoporosis. Talk with your health care provider about your test results, treatment options, and if necessary, the need for more tests. Follow these instructions at home: Eating and drinking  Eat a diet that includes fresh fruits and vegetables, whole  grains, lean protein, and low-fat dairy products.  Take vitamin and mineral supplements as recommended by your health care provider.  Do not drink alcohol if: ? Your health care provider tells you not to drink. ? You are pregnant, may be pregnant, or are planning to become pregnant.  If you drink alcohol: ? Limit how much you have to 0-1 drink a day. ? Be aware of how much alcohol is in your drink. In the [place] one drink equals one 12 oz bottle of beer (355 mL), one 5 oz glass of wine (148 mL), or one 1 oz glass of hard liquor (44 mL).   Lifestyle  Take daily care of your teeth and gums. Brush your teeth every morning and night with fluoride toothpaste. Floss one time each day.  Stay active. Exercise for at least 30 minutes 5 or more days each week.  Do not use any products that contain nicotine or tobacco, such as cigarettes, e-cigarettes, and chewing tobacco. If you need help quitting, ask your health care provider.  Do not use drugs.  If you are sexually active, practice safe sex. Use a condom or other form of protection to prevent STIs (sexually transmitted infections).  If you do not wish to become pregnant, use a form of birth control. If you plan to become pregnant, see your health care provider for a prepregnancy visit.  If told by your health care provider, take low-dose aspirin daily starting at age 34.  Find healthy ways to cope with stress, such as: ? Meditation, yoga, or listening to music. ? Journaling. ? Talking to a trusted person. ? Spending time with friends and family. Safety  Always wear your seat belt while driving or riding in a vehicle.  Do not drive: ? If you have been drinking alcohol. Do not ride with someone who has been drinking. ? When you are tired or distracted. ? While texting.  Wear a helmet and other protective equipment during sports activities.  If you have firearms in your house, make sure you follow all gun safety procedures. What's  next?  Visit your health care provider once a year for an annual wellness visit.  Ask your health care provider how often you should have your eyes and teeth checked.  Stay up to date on all vaccines. This information is not intended to replace advice given to you by your health care provider. Make sure you discuss any questions you have with your health care provider. Document Revised: 01/29/2020 Document Reviewed: 01/05/2018 Elsevier Patient Education  2021 [employer].

## 2020-06-27 LAB — CYTOLOGY - PAP
Comment: NEGATIVE
Diagnosis: NEGATIVE
High risk HPV: NEGATIVE

## 2020-07-07 ENCOUNTER — Encounter: Payer: Self-pay | Admitting: Family Medicine

## 2020-07-07 DIAGNOSIS — Z01812 Encounter for preprocedural laboratory examination: Secondary | ICD-10-CM | POA: Diagnosis not present

## 2020-07-10 DIAGNOSIS — Z86010 Personal history of colonic polyps: Secondary | ICD-10-CM | POA: Diagnosis not present

## 2020-07-10 DIAGNOSIS — K5730 Diverticulosis of large intestine without perforation or abscess without bleeding: Secondary | ICD-10-CM | POA: Diagnosis not present

## 2020-08-26 ENCOUNTER — Encounter: Payer: Self-pay | Admitting: Family Medicine

## 2020-09-01 ENCOUNTER — Encounter: Payer: Self-pay | Admitting: Family Medicine

## 2020-11-22 ENCOUNTER — Encounter: Payer: Self-pay | Admitting: Family Medicine

## 2020-11-24 ENCOUNTER — Other Ambulatory Visit: Payer: Self-pay

## 2020-11-28 ENCOUNTER — Telehealth: Payer: Self-pay

## 2020-11-28 ENCOUNTER — Other Ambulatory Visit: Payer: Self-pay

## 2020-11-28 MED ORDER — VALSARTAN-HYDROCHLOROTHIAZIDE 160-25 MG PO TABS: 1.0000 | ORAL_TABLET | Freq: Every day | ORAL | 1 refills | Status: DC

## 2020-11-28 NOTE — Telephone Encounter (Signed)
I spoke with the pt to address her concerns. I made her aware that medication had been faxed to [facility]. I also confirmed confirmation fax. Pt was pleased and expressed understanding.

## 2020-11-28 NOTE — Telephone Encounter (Signed)
error 

## 2020-12-24 ENCOUNTER — Ambulatory Visit: Payer: BC Managed Care – PPO | Admitting: Family Medicine

## 2020-12-24 ENCOUNTER — Ambulatory Visit (INDEPENDENT_AMBULATORY_CARE_PROVIDER_SITE_OTHER): Payer: BC Managed Care – PPO

## 2020-12-24 ENCOUNTER — Other Ambulatory Visit: Payer: Self-pay

## 2020-12-24 DIAGNOSIS — Z23 Encounter for immunization: Secondary | ICD-10-CM | POA: Diagnosis not present

## 2021-03-21 ENCOUNTER — Other Ambulatory Visit: Payer: Self-pay | Admitting: Family Medicine

## 2021-05-15 DIAGNOSIS — Z1231 Encounter for screening mammogram for malignant neoplasm of breast: Secondary | ICD-10-CM | POA: Diagnosis not present

## 2021-05-15 LAB — HM MAMMOGRAPHY

## 2021-05-19 ENCOUNTER — Encounter: Payer: Self-pay | Admitting: Family Medicine

## 2021-06-29 ENCOUNTER — Encounter: Payer: BC Managed Care – PPO | Admitting: Family Medicine

## 2021-07-06 ENCOUNTER — Ambulatory Visit (INDEPENDENT_AMBULATORY_CARE_PROVIDER_SITE_OTHER): Payer: BC Managed Care – PPO | Admitting: Family Medicine

## 2021-07-06 ENCOUNTER — Other Ambulatory Visit: Payer: Self-pay

## 2021-07-06 ENCOUNTER — Encounter: Payer: Self-pay | Admitting: Family Medicine

## 2021-07-06 VITALS — BP 102/72 | HR 68 | Temp 98.1°F | Wt 141.2 lb

## 2021-07-06 DIAGNOSIS — H8112 Benign paroxysmal vertigo, left ear: Secondary | ICD-10-CM

## 2021-07-06 DIAGNOSIS — Z0000 Encounter for general adult medical examination without abnormal findings: Secondary | ICD-10-CM | POA: Diagnosis not present

## 2021-07-06 DIAGNOSIS — I10 Essential (primary) hypertension: Secondary | ICD-10-CM | POA: Diagnosis not present

## 2021-07-06 DIAGNOSIS — E785 Hyperlipidemia, unspecified: Secondary | ICD-10-CM

## 2021-07-06 DIAGNOSIS — Z86010 Personal history of colonic polyps: Secondary | ICD-10-CM | POA: Diagnosis not present

## 2021-07-06 LAB — COMPREHENSIVE METABOLIC PANEL
ALT: 15 U/L (ref 0–35)
AST: 20 U/L (ref 0–37)
Albumin: 4.6 g/dL (ref 3.5–5.2)
Alkaline Phosphatase: 62 U/L (ref 39–117)
BUN: 21 mg/dL (ref 6–23)
CO2: 25 mEq/L (ref 19–32)
Calcium: 9.8 mg/dL (ref 8.4–10.5)
Chloride: 103 mEq/L (ref 96–112)
Creatinine, Ser: 0.77 mg/dL (ref 0.40–1.20)
GFR: 81.4 mL/min (ref >60.00)
Glucose, Bld: 102 mg/dL — ABNORMAL HIGH (ref 70–99)
Potassium: 3.6 mEq/L (ref 3.5–5.1)
Sodium: 135 mEq/L (ref 135–145)
Total Bilirubin: 0.9 mg/dL (ref 0.2–1.2)
Total Protein: 7.8 g/dL (ref 6.0–8.3)

## 2021-07-06 LAB — LIPID PANEL
Cholesterol: 251 mg/dL — ABNORMAL HIGH (ref 0–200)
HDL: 61.1 mg/dL (ref >39.00)
LDL Cholesterol: 173 mg/dL — ABNORMAL HIGH (ref 0–99)
NonHDL: 189.68
Total CHOL/HDL Ratio: 4
Triglycerides: 82 mg/dL (ref 0.0–149.0)
VLDL: 16.4 mg/dL (ref 0.0–40.0)

## 2021-07-06 LAB — CBC WITH DIFFERENTIAL/PLATELET
Basophils Absolute: 0 10*3/uL (ref 0.0–0.1)
Basophils Relative: 0.9 % (ref 0.0–3.0)
Eosinophils Absolute: 0.1 10*3/uL (ref 0.0–0.7)
Eosinophils Relative: 1.8 % (ref 0.0–5.0)
HCT: 40.1 % (ref 36.0–46.0)
Hemoglobin: 13.7 g/dL (ref 12.0–15.0)
Lymphocytes Relative: 48.8 % — ABNORMAL HIGH (ref 12.0–46.0)
Lymphs Abs: 2.3 10*3/uL (ref 0.7–4.0)
MCHC: 34.2 g/dL (ref 30.0–36.0)
MCV: 93.5 fl (ref 78.0–100.0)
Monocytes Absolute: 0.2 10*3/uL (ref 0.1–1.0)
Monocytes Relative: 5 % (ref 3.0–12.0)
Neutro Abs: 2.1 10*3/uL (ref 1.4–7.7)
Neutrophils Relative %: 43.5 % (ref 43.0–77.0)
Platelets: 271 10*3/uL (ref 150.0–400.0)
RBC: 4.29 Mil/uL (ref 3.87–5.11)
RDW: 12.5 % (ref 11.5–15.5)
WBC: 4.7 10*3/uL (ref 4.0–10.5)

## 2021-07-06 MED ORDER — DIOVAN HCT 160-25 MG PO TABS: 1.0000 | ORAL_TABLET | Freq: Every day | ORAL | Status: DC

## 2021-07-06 NOTE — Patient Instructions (Signed)
Please return in 12 months for your annual complete physical; please come fasting. Please sign Release of records to get colonoscopy report from Dr. Osborn Coho office.  I will release your lab results to you on your MyChart account with further instructions. You may see the results before I do, but when I review them I will send you a message with my report or have my assistant call you if things need to be discussed. Please reply to my message with any questions. Thank you!   You look great! Take care and call me if you need anything!  If you have any questions or concerns, please don't hesitate to send me a message via MyChart or call the office at [phone]. Thank you for visiting with [place] today! It's our pleasure caring for you.

## 2021-07-06 NOTE — Progress Notes (Signed)
Subjective  Chief Complaint  Patient presents with   Annual Exam    Fasting    HPI: Summer Smith is a 66 y.o. female who presents to [facility] [place] today for a Female Wellness Visit. She also has the concerns and/or needs as listed above in the chief complaint. These will be addressed in addition to the Health Maintenance Visit.   Wellness Visit: annual visit with health maintenance review and exam without Pap  HM: had colonoscopy last spring with Dr. Matthias Hughs, pt reports it was normal. Other screens are current. Imms are current. 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 Walking daily for an hour. Chronic disease f/u and/or acute problem visit: (deemed necessary to be done in addition to the wellness visit): HTN: Feeling well. Taking medications w/o adverse effects. No symptoms of CHF, angina; no palpitations, sob, cp or lower extremity edema. Compliant with meds. Needs name brand diovan hct; gets from pharmacy in [place]. No concerns.  H/o elevated cholesterol;not yet needing meds. Eats well and now exercises daily. Wants to avoid meds if possible.  H/o polyps but nl recent colonoscopy; back to 10 year screens. Dr. Huntley Dec retired. Will request records.  Had mild bout of BPPV last week; only when turned head to left. Now resolved. No ear pain.   Assessment  1. Annual physical exam   2. Essential hypertension   3. History of colon polyps   4. Dyslipidemia   5. Benign paroxysmal positional vertigo of left ear      Plan  Female Wellness Visit: Age appropriate Health Maintenance and Prevention measures were discussed with patient. Included topics are cancer screening recommendations, ways to keep healthy (see AVS) including dietary and exercise recommendations, regular eye and dental care, use of seat belts, and avoidance of moderate alcohol use and tobacco use.  BMI: discussed patient's BMI and encouraged positive lifestyle modifications to help get to  or maintain a target BMI. HM needs and immunizations were addressed and ordered. See below for orders. See HM and immunization section for updates. Routine labs and screening tests ordered including cmp, cbc and lipids where appropriate. Discussed recommendations regarding Vit D and calcium supplementation (see AVS)  Chronic disease management visit and/or acute problem visit: HTN: is very well controlled. Continue amlodipine 10 and diovan hct 160/25. No palpitatins. No Aes. Check renal function and electrolytes. HLD monitoring. Eats well.  Colon polyps. Next crc screen due in 9 years.  BPPV: improved.   Follow up: Return in about 1 year (around 07/06/2022) for complete physical.  Orders Placed This Encounter  Procedures   CBC with Differential/Platelet   Comprehensive metabolic panel   Lipid panel   Meds ordered this encounter  Medications   DIOVAN HCT 160-25 MG tablet    Sig: Take 1 tablet by mouth daily.      Body mass index is 23.5 kg/m. Wt Readings from Last 3 Encounters:  07/06/21 141 lb 3.2 oz (64 kg)  06/26/20 138 lb 3.2 oz (62.7 kg)  03/07/20 139 lb (63 kg)     Patient Active Problem List   Diagnosis Date Noted   Dyslipidemia 03/07/2020   History of colon polyps 03/07/2020   PAC (premature atrial contraction) 02/07/2020    01/2020: nl Echocardiogram. EKG with PACs. zio patch pending. Nl labs.? Stress induced.    Essential hypertension 04/22/2016   Health Maintenance  Topic Date Due   COVID-19 Vaccine (3 - Booster for Pfizer series) 10/10/2019  INFLUENZA VACCINE  12/08/2020   MAMMOGRAM  05/15/2022   COLONOSCOPY (Pts 45-71yrs Insurance coverage will need to be confirmed)  03/08/2023   PAP SMEAR-Modifier  06/26/2025   TETANUS/TDAP  09/29/2027   Hepatitis C Screening  Completed   Zoster Vaccines- Shingrix  Completed   HPV VACCINES  Aged Out   HIV Screening  Discontinued   Immunization History  Administered Date(s) Administered   Influenza,inj,Quad PF,6+  Mos 02/06/2017, 02/01/2019, 02/07/2020   Influenza-Unspecified 02/16/2016, 03/01/2018   PFIZER(Purple Top)SARS-COV-2 Vaccination 07/25/2019, 08/15/2019   Td 03/10/2005   Tdap 12/30/2010, 09/28/2017   Zoster Recombinat (Shingrix) 06/26/2020, 12/24/2020   We updated and reviewed the patient's past history in detail and it is documented below. Allergies: Patient is allergic to valsartan. Past Medical History Patient  has a past medical history of Dyslipidemia (03/07/2020) and Hypertension. Past Surgical History Patient  has no past surgical history on file. Family History: Patient family history includes Arthritis in her mother; Diabetes in her brother, mother, and sister; Heart disease in her father; Hypertension in her father, paternal grandfather, and paternal grandmother. Social History:  Patient  reports that she has never smoked. She has never used smokeless tobacco. She reports current alcohol use. She reports that she does not use drugs.  Review of Systems: Constitutional: negative for fever or malaise Ophthalmic: negative for photophobia, double vision or loss of vision Cardiovascular: negative for chest pain, dyspnea on exertion, or new LE swelling Respiratory: negative for SOB or persistent cough Gastrointestinal: negative for abdominal pain, change in bowel habits or melena Genitourinary: negative for dysuria or gross hematuria, no abnormal uterine bleeding or disharge Musculoskeletal: negative for new gait disturbance or muscular weakness Integumentary: negative for new or persistent rashes, no breast lumps Neurological: negative for TIA or stroke symptoms Psychiatric: negative for SI or delusions Allergic/Immunologic: negative for hives  Patient Care Team    Relationship Specialty Notifications Start End  Leamon Arnt, MD PCP - General Family Medicine  03/07/20   Ronald Lobo, MD Consulting Physician Gastroenterology  08/08/19     Objective  Vitals: BP 102/72     Pulse 68    Temp 98.1 F (36.7 C) (Temporal)    Wt 141 lb 3.2 oz (64 kg)    SpO2 98%    BMI 23.50 kg/m  General:  Well developed, well nourished, no acute distress  Psych:  Alert and orientedx3,normal mood and affect HEENT:  Normocephalic, atraumatic, non-icteric sclera,  supple neck without adenopathy, mass or thyromegaly Cardiovascular:  Normal S1, S2, RRR without gallop, rub or murmur Respiratory:  Good breath sounds bilaterally, CTAB with normal respiratory effort Gastrointestinal: normal bowel sounds, soft, non-tender, no noted masses. No HSM MSK: no deformities, contusions. Joints are without erythema or swelling.  Skin:  Warm, no rashes or suspicious lesions noted Neurologic:    Mental status is normal. CN 2-11 are normal. Gross motor and sensory exams are normal. Normal gait. No tremor   Commons side effects, risks, benefits, and alternatives for medications and treatment plan prescribed today were discussed, and the patient expressed understanding of the given instructions. Patient is instructed to call or message via MyChart if he/she has any questions or concerns regarding our treatment plan. No barriers to understanding were identified. We discussed Red Flag symptoms and signs in detail. Patient expressed understanding regarding what to do in case of urgent or emergency type symptoms.  Medication list was reconciled, printed and provided to the patient in AVS. Patient instructions and summary information was  reviewed with the patient as documented in the AVS. This note was prepared with assistance of Dragon voice recognition software. Occasional wrong-word or sound-a-like substitutions may have occurred due to the inherent limitations of voice recognition software  This visit occurred during the SARS-CoV-2 public health emergency.  Safety protocols were in place, including screening questions prior to the visit, additional usage of staff PPE, and extensive cleaning of exam room while  observing appropriate contact time as indicated for disinfecting solutions.

## 2021-08-25 ENCOUNTER — Telehealth: Payer: Self-pay | Admitting: Family Medicine

## 2021-08-25 NOTE — Telephone Encounter (Signed)
Sent email over to coding and billing.    Requested DOS to be reviewed and for billing team to reach out to patient.

## 2021-08-25 NOTE — Telephone Encounter (Signed)
Coding issue with 06/2021 CPE. ? ?Coding has been updated recently, but pt states she should still not be charged because husband at competing practice is not getting charged. ? ?Please call back. ?

## 2021-12-24 ENCOUNTER — Encounter: Payer: Self-pay | Admitting: Family Medicine

## 2021-12-24 ENCOUNTER — Ambulatory Visit (INDEPENDENT_AMBULATORY_CARE_PROVIDER_SITE_OTHER): Payer: Medicare Other | Admitting: Family Medicine

## 2021-12-24 VITALS — BP 110/70 | HR 69 | Temp 97.8°F | Ht 65.0 in | Wt 143.4 lb

## 2021-12-24 DIAGNOSIS — E785 Hyperlipidemia, unspecified: Secondary | ICD-10-CM

## 2021-12-24 DIAGNOSIS — Z0000 Encounter for general adult medical examination without abnormal findings: Secondary | ICD-10-CM

## 2021-12-24 DIAGNOSIS — Z23 Encounter for immunization: Secondary | ICD-10-CM | POA: Diagnosis not present

## 2021-12-24 DIAGNOSIS — I10 Essential (primary) hypertension: Secondary | ICD-10-CM | POA: Diagnosis not present

## 2021-12-24 DIAGNOSIS — Z780 Asymptomatic menopausal state: Secondary | ICD-10-CM

## 2021-12-24 LAB — LIPID PANEL
Cholesterol: 244 mg/dL — ABNORMAL HIGH (ref 0–200)
HDL: 62.3 mg/dL (ref >39.00)
LDL Cholesterol: 161 mg/dL — ABNORMAL HIGH (ref 0–99)
NonHDL: 181.82
Total CHOL/HDL Ratio: 4
Triglycerides: 103 mg/dL (ref 0.0–149.0)
VLDL: 20.6 mg/dL (ref 0.0–40.0)

## 2021-12-24 NOTE — Progress Notes (Signed)
Subjective:    Summer Smith is a 66 y.o. female who presents for a Welcome to Medicare exam.  Also here for f/u HTN and HLD:  Taking bp meds and feels well Now exercising daily, 4-5 miles walking!  + FH stroke.   Review of Systems No cp, no neuro sxs. No le edema. No cognitive concerns. No mood concerns. No GI pain.         Objective:    Today's Vitals   12/24/21 0909  BP: 110/70  Pulse: 69  Temp: 97.8 F (36.6 C)  SpO2: 95%  Weight: 143 lb 6.4 oz (65 kg)  Height: 5\' 5"  (1.651 m)  Body mass index is 23.86 kg/m.  Medications Outpatient Encounter Medications as of 12/24/2021  Medication Sig   amLODipine (NORVASC) 10 MG tablet TAKE ONE TABLET BY MOUTH DAILY   calcium carbonate (OSCAL) 1500 (600 Ca) MG TABS tablet Take by mouth every other day.   DIOVAN HCT 160-25 MG tablet Take 1 tablet by mouth daily.   No facility-administered encounter medications on file as of 12/24/2021.     History: Past Medical History:  Diagnosis Date   Dyslipidemia 03/07/2020   Hypertension    No past surgical history on file.  Family History  Problem Relation Age of Onset   Arthritis Mother    Diabetes Mother    Heart disease Father    Hypertension Father    Diabetes Sister    Diabetes Brother    Hypertension Paternal Grandmother    Hypertension Paternal Grandfather    Stroke Neg Hx    COPD Neg Hx    Social History   Occupational History   Occupation: 03/09/2020: [employer]   Occupation: former Engineer, drilling  Tobacco Use   Smoking status: Never   Smokeless tobacco: Never  Substance and Sexual Activity   Alcohol use: Yes    Alcohol/week: 0.0 standard drinks of alcohol   Drug use: Never   Sexual activity: Yes    Birth control/protection: Post-menopausal    Tobacco Counseling Counseling given: Not Answered   Immunizations and Health Maintenance Immunization History  Administered Date(s) Administered   Influenza,inj,Quad PF,6+ Mos  02/06/2017, 02/01/2019, 02/07/2020   Influenza-Unspecified 02/16/2016, 03/01/2018   PFIZER(Purple Top)SARS-COV-2 Vaccination 07/25/2019, 08/15/2019   Td 03/10/2005   Tdap 12/30/2010, 09/28/2017   Zoster Recombinat (Shingrix) 06/26/2020, 12/24/2020   Health Maintenance Due  Topic Date Due   COVID-19 Vaccine (3 - Pfizer series) 10/10/2019   Pneumonia Vaccine 44+ Years old (1 - PCV) Never done   DEXA SCAN  Never done   INFLUENZA VACCINE  12/08/2021    Activities of Daily Living    12/24/2021    9:11 AM  In your present state of health, do you have any difficulty performing the following activities:  Hearing? 0  Vision? 0  Difficulty concentrating or making decisions? 0  Walking or climbing stairs? 0  Dressing or bathing? 0  Doing errands, shopping? 0    Physical Exam  appears well, RRR (optional), or other factors deemed appropriate based on the beneficiary's medical and social history and current clinical standards.  Advanced Directives: pt is updating living will and healthcare POA. Will bring in copy when done.       Assessment:    This is a routine wellness examination for this patient . 66 and feels well.   Vision/Hearing screen  Vision Screening   Right eye Left eye Both eyes  Without correction 20/30  20/40 20/30  With correction      Normal hearing screen  Dietary issues and exercise activities discussed:  Current Exercise Habits: Home exercise routine, Type of exercise: walking, Time (Minutes): 60, Frequency (Times/Week): 7, Weekly Exercise (Minutes/Week): 420, Intensity: Moderate Walks 4-5 miles daily. Healthy eating   Goals   None    Depression Screen    12/24/2021    9:11 AM 07/06/2021   10:55 AM 06/26/2020   11:11 AM 03/07/2020    9:47 AM  PHQ 2/9 Scores  PHQ - 2 Score 0 0 0 0     Fall Risk    12/24/2021    9:11 AM  Fall Risk   Falls in the past year? 0  Number falls in past yr: 0  Injury with Fall? 0  Risk for fall due to : No Fall Risks   Follow up Falls evaluation completed    Cognitive Function:  Mini cog screen: 3/3 recall and normal clock      Patient Care Team: Willow Ora, MD as PCP - General (Family Medicine) Bernette Redbird, MD as Consulting Physician (Gastroenterology)     Plan:   Welcome to medicare: pt working on advance directives.prevnar 20 today. Dexa screening discussed and ordered.  HTN: well controlled on amlodipine/diovan.  HLD: elevated LDL and family history of CAD and CVA recent in sister: will recheck since improving exercise and start statin if indicated. If cholesterol is better, can monitor over time. Pt agrees.   I have personally reviewed and noted the following in the patient's chart:   Medical and social history Use of alcohol, tobacco or illicit drugs  Current medications and supplements Functional ability and status Nutritional status Physical activity Advanced directives List of other physicians Hospitalizations, surgeries, and ER visits in previous 12 months Vitals Screenings to include cognitive, depression, and falls Referrals and appointments  In addition, I have reviewed and discussed with patient certain preventive protocols, quality metrics, and best practice recommendations. A written personalized care plan for preventive services as well as general preventive health recommendations were provided to patient.     Willow Ora, MD 12/24/2021

## 2021-12-24 NOTE — Patient Instructions (Addendum)
Please return in 6 months for your annual complete physical; please come fasting.   I will release your lab results to you on your MyChart account with further instructions. You may see the results before I do, but when I review them I will send you a message with my report or have my assistant call you if things need to be discussed. Please reply to my message with any questions. Thank you!   If you have any questions or concerns, please don't hesitate to send me a message via MyChart or call the office at [phone]. Thank you for visiting with [place] today! It's our pleasure caring for you.   I have ordered a mammogram and/or bone density for you as we discussed today: []   Mammogram  [x]   Bone Density  Please call the office checked below to schedule your appointment:  []   [facility]      [address] [place],        [address] Wing         [x]   [facility]  [address] [place],  [facility]  Ms. Cranmore , Thank you for taking time to come for your Medicare Wellness Visit. I appreciate your ongoing commitment to your health goals. Please review the following plan we discussed and let me know if I can assist you in the future.   These are the goals we discussed:  Goals   None     This is a list of the screening recommended for you and due dates:  Health Maintenance  Topic Date Due   Pneumonia Vaccine (1 - PCV) Never done   DEXA scan (bone density measurement)  Never done   Flu Shot  12/08/2021   COVID-19 Vaccine (3 - Pfizer series) 01/09/2022*   Mammogram  05/15/2022   Colon Cancer Screening  03/08/2023   Pap Smear  06/26/2025   Tetanus Vaccine  09/29/2027   Hepatitis C Screening: USPSTF Recommendation to screen - Ages 18-79 yo.  Completed   Zoster (Shingles) Vaccine  Completed   HPV Vaccine  Aged Out   HIV Screening  Discontinued  *Topic was postponed. The date shown is not the original due date.

## 2021-12-28 ENCOUNTER — Encounter: Payer: Self-pay | Admitting: Family Medicine

## 2021-12-28 MED ORDER — ROSUVASTATIN CALCIUM 5 MG PO TABS: 5.0000 mg | ORAL_TABLET | Freq: Every evening | ORAL | 3 refills | Status: DC

## 2022-01-04 ENCOUNTER — Other Ambulatory Visit: Payer: Self-pay

## 2022-01-04 ENCOUNTER — Telehealth: Payer: Self-pay | Admitting: Family Medicine

## 2022-01-04 NOTE — Telephone Encounter (Signed)
..   Encourage patient to contact the pharmacy for refills or they can request refills through [facility]  LAST APPOINTMENT DATE:  Please schedule appointment if longer than 1 year  12/24/21  NEXT APPOINTMENT DATE: 07/07/22  MEDICATION:  DIOVAN HCT 160-25 MG tablet  Is the patient out of medication? No  PHARMACY:  [email or web address]  (Patient states Pharmacy sent request via fax 4-5 days ago with no response)  Phone# [phone] [email or web address] Order# [id number]  Let patient know to contact pharmacy at the end of the day to make sure medication is ready.  Please notify patient to allow 48-72 hours to process

## 2022-01-04 NOTE — Telephone Encounter (Signed)
Spoke with pt and told her that I have not received a Rx request yet so she said that she would contact them again to make sure that they have the right fax#

## 2022-01-05 ENCOUNTER — Other Ambulatory Visit: Payer: Self-pay | Admitting: *Deleted

## 2022-01-05 NOTE — Telephone Encounter (Signed)
Tommi Rumps we have to wait on a faxed request?

## 2022-01-06 NOTE — Telephone Encounter (Signed)
I have received the fax and as soon as Summer Smith sign off on it I will fax it over. Pt has been made aware thru MyChart message.

## 2022-01-06 NOTE — Telephone Encounter (Signed)
Paper has been faxed. 

## 2022-02-01 ENCOUNTER — Encounter: Payer: Self-pay | Admitting: *Deleted

## 2022-03-29 ENCOUNTER — Other Ambulatory Visit: Payer: Self-pay | Admitting: Family Medicine

## 2022-04-22 ENCOUNTER — Encounter: Payer: Self-pay | Admitting: *Deleted

## 2022-05-21 DIAGNOSIS — Z1231 Encounter for screening mammogram for malignant neoplasm of breast: Secondary | ICD-10-CM | POA: Diagnosis not present

## 2022-05-21 LAB — HM MAMMOGRAPHY

## 2022-05-25 ENCOUNTER — Encounter: Payer: Self-pay | Admitting: Family Medicine

## 2022-06-25 ENCOUNTER — Telehealth: Payer: Self-pay | Admitting: Family Medicine

## 2022-06-25 ENCOUNTER — Other Ambulatory Visit: Payer: Self-pay

## 2022-06-25 MED ORDER — ROSUVASTATIN CALCIUM 5 MG PO TABS: 5.0000 mg | ORAL_TABLET | Freq: Every evening | ORAL | 3 refills | Status: DC

## 2022-06-25 NOTE — Telephone Encounter (Signed)
  LAST APPOINTMENT DATE:  12/24/21  NEXT APPOINTMENT DATE: 07/28/22  MEDICATION:  rosuvastatin (CRESTOR) 5 MG tablet    Is the patient out of medication? Approx. 7-10 days left  PHARMACY:  [facility] [id number] - [place], [place] Phone: [phone]  Fax: [phone]      Let patient know to contact pharmacy at the end of the day to make sure medication is ready.  Please notify patient to allow 48-72 hours to process

## 2022-06-25 NOTE — Telephone Encounter (Signed)
Rx sent 

## 2022-06-29 DIAGNOSIS — K080 Exfoliation of teeth due to systemic causes: Secondary | ICD-10-CM | POA: Diagnosis not present

## 2022-07-07 ENCOUNTER — Encounter: Payer: BC Managed Care – PPO | Admitting: Family Medicine

## 2022-07-28 ENCOUNTER — Ambulatory Visit (INDEPENDENT_AMBULATORY_CARE_PROVIDER_SITE_OTHER): Payer: Medicare Other | Admitting: Family Medicine

## 2022-07-28 ENCOUNTER — Encounter: Payer: Self-pay | Admitting: Family Medicine

## 2022-07-28 VITALS — BP 120/70 | HR 67 | Temp 98.6°F | Ht 65.0 in | Wt 141.4 lb

## 2022-07-28 DIAGNOSIS — Z823 Family history of stroke: Secondary | ICD-10-CM | POA: Insufficient documentation

## 2022-07-28 DIAGNOSIS — Z87898 Personal history of other specified conditions: Secondary | ICD-10-CM | POA: Diagnosis not present

## 2022-07-28 DIAGNOSIS — Z8249 Family history of ischemic heart disease and other diseases of the circulatory system: Secondary | ICD-10-CM

## 2022-07-28 DIAGNOSIS — E785 Hyperlipidemia, unspecified: Secondary | ICD-10-CM

## 2022-07-28 DIAGNOSIS — Z23 Encounter for immunization: Secondary | ICD-10-CM | POA: Diagnosis not present

## 2022-07-28 DIAGNOSIS — Z86010 Personal history of colonic polyps: Secondary | ICD-10-CM | POA: Diagnosis not present

## 2022-07-28 DIAGNOSIS — I10 Essential (primary) hypertension: Secondary | ICD-10-CM | POA: Diagnosis not present

## 2022-07-28 DIAGNOSIS — Z0000 Encounter for general adult medical examination without abnormal findings: Secondary | ICD-10-CM

## 2022-07-28 LAB — CBC WITH DIFFERENTIAL/PLATELET
Basophils Absolute: 0 10*3/uL (ref 0.0–0.1)
Basophils Relative: 0.5 % (ref 0.0–3.0)
Eosinophils Absolute: 0.1 10*3/uL (ref 0.0–0.7)
Eosinophils Relative: 1.3 % (ref 0.0–5.0)
HCT: 43.1 % (ref 36.0–46.0)
Hemoglobin: 14.8 g/dL (ref 12.0–15.0)
Lymphocytes Relative: 41.8 % (ref 12.0–46.0)
Lymphs Abs: 2.6 10*3/uL (ref 0.7–4.0)
MCHC: 34.3 g/dL (ref 30.0–36.0)
MCV: 93.8 fl (ref 78.0–100.0)
Monocytes Absolute: 0.3 10*3/uL (ref 0.1–1.0)
Monocytes Relative: 4.2 % (ref 3.0–12.0)
Neutro Abs: 3.2 10*3/uL (ref 1.4–7.7)
Neutrophils Relative %: 52.2 % (ref 43.0–77.0)
Platelets: 278 10*3/uL (ref 150.0–400.0)
RBC: 4.6 Mil/uL (ref 3.87–5.11)
RDW: 12.8 % (ref 11.5–15.5)
WBC: 6.2 10*3/uL (ref 4.0–10.5)

## 2022-07-28 LAB — LIPID PANEL
Cholesterol: 154 mg/dL (ref 0–200)
HDL: 66.4 mg/dL (ref >39.00)
LDL Cholesterol: 71 mg/dL (ref 0–99)
NonHDL: 87.84
Total CHOL/HDL Ratio: 2
Triglycerides: 83 mg/dL (ref 0.0–149.0)
VLDL: 16.6 mg/dL (ref 0.0–40.0)

## 2022-07-28 LAB — COMPREHENSIVE METABOLIC PANEL
ALT: 15 U/L (ref 0–35)
AST: 16 U/L (ref 0–37)
Albumin: 4.7 g/dL (ref 3.5–5.2)
Alkaline Phosphatase: 62 U/L (ref 39–117)
BUN: 15 mg/dL (ref 6–23)
CO2: 25 mEq/L (ref 19–32)
Calcium: 10.3 mg/dL (ref 8.4–10.5)
Chloride: 102 mEq/L (ref 96–112)
Creatinine, Ser: 0.73 mg/dL (ref 0.40–1.20)
GFR: 86.14 mL/min (ref >60.00)
Glucose, Bld: 101 mg/dL — ABNORMAL HIGH (ref 70–99)
Potassium: 3.6 mEq/L (ref 3.5–5.1)
Sodium: 137 mEq/L (ref 135–145)
Total Bilirubin: 0.6 mg/dL (ref 0.2–1.2)
Total Protein: 7.7 g/dL (ref 6.0–8.3)

## 2022-07-28 LAB — HEMOGLOBIN A1C: Hgb A1c MFr Bld: 5.9 % (ref 4.6–6.5)

## 2022-07-28 MED ORDER — DIOVAN HCT 160-25 MG PO TABS
1.0000 | ORAL_TABLET | Freq: Every day | ORAL | 3 refills | Status: DC
Start: 2022-07-28 — End: 2023-04-25

## 2022-07-28 NOTE — Patient Instructions (Signed)

## 2022-07-28 NOTE — Progress Notes (Signed)
Subjective  Chief Complaint  Patient presents with   Annual Exam    Pt here for Annual Exam and is currently fasting     HPI: Summer Smith is a 67 y.o. female who presents to [facility] today for a Female Wellness Visit. She also has the concerns and/or needs as listed above in the chief complaint. These will be addressed in addition to the Health Maintenance Visit.   Wellness Visit: annual visit with health maintenance review and exam without Pap  HM: mammo current and negative. Pap up to date. Crc screen 2022 and normal. Flu shot today. Healthy lifestyle. Nl mood. Due eye exam Chronic disease f/u and/or acute problem visit: (deemed necessary to be done in addition to the wellness visit): HTN: on diovan hct and amlodipine and feels fine. No cp,sob or palpitations. Exercises daily (walking).  Started crestor 5 and tolerating well. Has some questions. Strong fh of vascular disease: stroke, CAD, and cerebral aneurysms. Fasting for recheck   Assessment  1. Annual physical exam   2. Essential hypertension   3. Dyslipidemia   4. History of colon polyps   5. History of prediabetes   6. Need for immunization against influenza   7. Family history of cerebral aneurysm   8. Family history of premature CAD   9. Family history of CVA      Plan  Female Wellness Visit: Age appropriate Health Maintenance and Prevention measures were discussed with patient. Included topics are cancer screening recommendations, ways to keep healthy (see AVS) including dietary and exercise recommendations, regular eye and dental care, use of seat belts, and avoidance of moderate alcohol use and tobacco use. Defer dexa screen until early next year with mammo BMI: discussed patient's BMI and encouraged positive lifestyle modifications to help get to or maintain a target BMI. HM needs and immunizations were addressed and ordered. See below for orders. See HM and immunization section for  updates. Flu shot today Routine labs and screening tests ordered including cmp, cbc and lipids where appropriate. Discussed recommendations regarding Vit D and calcium supplementation (see AVS)  Chronic disease management visit and/or acute problem visit: Dyslipidemia: continue crestor 5 to lower vascular disease risk given borderline lipid readings and family history. Could consider lipoprotein analysis or cardiac CT if need more clarification on absolute risk.  HTN is well controlled. Check renal function and electrolytes.continue diovan hct 160/25 DAW daily and amlodipine 10 daily Continue healthy diet; recheck A1c given h/o high readings in past and strong fh of diabetes.   Follow up: 6 mo for htn  Orders Placed This Encounter  Procedures   Flu Vaccine QUAD High Dose(Fluad)   CBC with Differential/Platelet   Comprehensive metabolic panel   Hemoglobin A1c   Lipid panel   Meds ordered this encounter  Medications   DIOVAN HCT 160-25 MG tablet    Sig: Take 1 tablet by mouth daily.    Dispense:  90 tablet    Refill:  3      Body mass index is 23.53 kg/m. Wt Readings from Last 3 Encounters:  07/28/22 141 lb 6.4 oz (64.1 kg)  12/24/21 143 lb 6.4 oz (65 kg)  07/06/21 141 lb 3.2 oz (64 kg)     Patient Active Problem List   Diagnosis Date Noted   Family history of cerebral aneurysm 07/28/2022    Mom and brother    Family history of premature CAD father 52 07/28/2022   Family history of  CVA - sister 07/28/2022   Dyslipidemia 03/07/2020   History of colon polyps 03/07/2020    But normal most recent colonoscopy, dr Cristina Gong, returned to q 10 year screens.     PAC (premature atrial contraction) 02/07/2020    01/2020: nl Echocardiogram. EKG with PACs. zio patch pending. Nl labs.? Stress induced.    Essential hypertension 04/22/2016   Health Maintenance  Topic Date Due   COVID-19 Vaccine (3 - 2023-24 season) 08/13/2022 (Originally 01/08/2022)   DEXA SCAN  06/11/2023 (Originally  11/20/2021)   MAMMOGRAM  05/22/2023   PAP SMEAR-Modifier  06/26/2025   DTaP/Tdap/Td (4 - Td or Tdap) 09/29/2027   COLONOSCOPY (Pts 45-67yrs Insurance coverage will need to be confirmed)  08/09/2030   Pneumonia Vaccine 15+ Years old  Completed   INFLUENZA VACCINE  Completed   Hepatitis C Screening  Completed   Zoster Vaccines- Shingrix  Completed   HPV VACCINES  Aged Out   HIV Screening  Discontinued   Immunization History  Administered Date(s) Administered   Fluad Quad(high Dose 65+) 07/28/2022   Influenza,inj,Quad PF,6+ Mos 02/06/2017, 02/01/2019, 02/07/2020   Influenza-Unspecified 02/16/2016, 03/01/2018   PFIZER(Purple Top)SARS-COV-2 Vaccination 07/25/2019, 08/15/2019   PNEUMOCOCCAL CONJUGATE-20 12/24/2021   Td 03/10/2005   Tdap 12/30/2010, 09/28/2017   Zoster Recombinat (Shingrix) 06/26/2020, 12/24/2020   We updated and reviewed the patient's past history in detail and it is documented below. Allergies: Patient is allergic to valsartan. Past Medical History Patient  has a past medical history of Dyslipidemia (03/07/2020) and Hypertension. Past Surgical History Patient  has no past surgical history on file. Family History: Patient family history includes Arthritis in her mother; Cerebral aneurysm in her brother and mother; Diabetes in her brother, mother, and sister; Heart disease (age of onset: 59) in her father; Hypertension in her father, paternal grandfather, and paternal grandmother; Stroke in her sister. Social History:  Patient  reports that she has never smoked. She has never used smokeless tobacco. She reports current alcohol use. She reports that she does not use drugs.  Review of Systems: Constitutional: negative for fever or malaise Ophthalmic: negative for photophobia, double vision or loss of vision Cardiovascular: negative for chest pain, dyspnea on exertion, or new LE swelling Respiratory: negative for SOB or persistent cough Gastrointestinal: negative for  abdominal pain, change in bowel habits or melena Genitourinary: negative for dysuria or gross hematuria, no abnormal uterine bleeding or disharge Musculoskeletal: negative for new gait disturbance or muscular weakness Integumentary: negative for new or persistent rashes, no breast lumps Neurological: negative for TIA or stroke symptoms Psychiatric: negative for SI or delusions Allergic/Immunologic: negative for hives  Patient Care Team    Relationship Specialty Notifications Start End  Leamon Arnt, MD PCP - General Family Medicine  03/07/20   Ronald Lobo, MD Consulting Physician Gastroenterology  08/08/19     Objective  Vitals: BP 120/70   Pulse 67   Temp 98.6 F (37 C)   Ht 5\' 5"  (1.651 m)   Wt 141 lb 6.4 oz (64.1 kg)   SpO2 96%   BMI 23.53 kg/m  General:  Well developed, well nourished, no acute distress  Psych:  Alert and orientedx3,normal mood and affect HEENT:  Normocephalic, atraumatic, non-icteric sclera,  supple neck without adenopathy, mass or thyromegaly Cardiovascular:  Normal S1, S2, RRR without gallop, rub or murmur Respiratory:  Good breath sounds bilaterally, CTAB with normal respiratory effort Gastrointestinal: normal bowel sounds, soft, non-tender, no noted masses. No HSM MSK: no deformities, contusions. Joints are without  erythema or swelling.  Skin:  Warm, no rashes or suspicious lesions noted Neurologic:    Mental status is normal. Gross motor and sensory exams are normal. Normal gait. No tremor   Commons side effects, risks, benefits, and alternatives for medications and treatment plan prescribed today were discussed, and the patient expressed understanding of the given instructions. Patient is instructed to call or message via MyChart if he/she has any questions or concerns regarding our treatment plan. No barriers to understanding were identified. We discussed Red Flag symptoms and signs in detail. Patient expressed understanding regarding what to do  in case of urgent or emergency type symptoms.  Medication list was reconciled, printed and provided to the patient in AVS. Patient instructions and summary information was reviewed with the patient as documented in the AVS. This note was prepared with assistance of Dragon voice recognition software. Occasional wrong-word or sound-a-like substitutions may have occurred due to the inherent limitations of voice recognition software

## 2022-07-29 ENCOUNTER — Telehealth: Payer: Self-pay

## 2022-07-29 ENCOUNTER — Telehealth: Payer: Self-pay | Admitting: Family Medicine

## 2022-07-29 NOTE — Telephone Encounter (Signed)
Pt needs PA for Diovan CT 160-25mg  Tabs  [id number]  Thank you,  Leamon Arnt

## 2022-07-29 NOTE — Telephone Encounter (Signed)
Patient states she is not getting Diovan HCT 160-25 mg  from [facility]. Also states she already has 6 months worth of medication; does not need recently called in rx.

## 2022-08-16 ENCOUNTER — Telehealth: Payer: Self-pay

## 2022-08-16 ENCOUNTER — Other Ambulatory Visit (HOSPITAL_COMMUNITY): Payer: Self-pay

## 2022-08-16 NOTE — Telephone Encounter (Signed)
Pharmacy Patient Advocate Encounter   Received notification from Karin Golden that prior authorization for Diovan HCT 160-25MG  tablets is required/requested.  Per Test Claim: Non formulary   PA submitted on 08/16/22 to (ins) [employer] via CoverMyMeds Key or [facility]) confirmation # [id number] Status is pending

## 2022-08-16 NOTE — Telephone Encounter (Signed)
Patient Advocate Encounter  Prior Authorization for Diovan HCT 160-25MG  tablets has been approved.    PA# [id number]  Effective dates: 08/16/22 through 08/16/23

## 2022-08-16 NOTE — Telephone Encounter (Signed)
PA approved. Effective dates: 08/16/22-08/16/23

## 2022-08-16 NOTE — Telephone Encounter (Signed)
PA submitted via CMM. Key: [id number]. Status pending

## 2022-12-23 ENCOUNTER — Encounter (INDEPENDENT_AMBULATORY_CARE_PROVIDER_SITE_OTHER): Payer: Self-pay

## 2023-01-04 DIAGNOSIS — K080 Exfoliation of teeth due to systemic causes: Secondary | ICD-10-CM | POA: Diagnosis not present

## 2023-02-02 ENCOUNTER — Ambulatory Visit: Payer: Medicare Other | Admitting: Family Medicine

## 2023-02-02 ENCOUNTER — Encounter: Payer: Self-pay | Admitting: Family Medicine

## 2023-02-02 VITALS — BP 114/74 | HR 63 | Temp 97.4°F | Ht 65.0 in | Wt 147.4 lb

## 2023-02-02 DIAGNOSIS — I10 Essential (primary) hypertension: Secondary | ICD-10-CM

## 2023-02-02 DIAGNOSIS — Z780 Asymptomatic menopausal state: Secondary | ICD-10-CM

## 2023-02-02 DIAGNOSIS — I491 Atrial premature depolarization: Secondary | ICD-10-CM

## 2023-02-02 DIAGNOSIS — R7301 Impaired fasting glucose: Secondary | ICD-10-CM | POA: Diagnosis not present

## 2023-02-02 DIAGNOSIS — Z23 Encounter for immunization: Secondary | ICD-10-CM | POA: Diagnosis not present

## 2023-02-02 NOTE — Patient Instructions (Addendum)
Please return in 6 months for your annual complete physical; please come fasting.    If you have any questions or concerns, please don't hesitate to send me a message via MyChart or call the office at [phone]. Thank you for visiting with [place] today! It's our pleasure caring for you.   Please call the office checked below to schedule your appointment for your mammogram and/or bone density screen (the checked studies were ordered): they are due in January 2025 [x]   Mammogram  [x]   Bone Density  []   [facility]     [address] [place], [place]        [phone]         [x]   [facility]  [address] [place], [place]  [phone]

## 2023-02-02 NOTE — Progress Notes (Signed)
Subjective  CC:  Chief Complaint  Patient presents with   Hypertension    Pt here for 6 month f.u on hypertension     HPI: Summer Smith is a 67 y.o. female who presents to the office today to address the problems listed above in the chief complaint. Hypertension f/u: Control is good . Pt reports she is doing well. taking medications as instructed, no medication side effects noted, no TIAs, no chest pain on exertion, no dyspnea on exertion, no swelling of ankles. She denies adverse effects from his BP medications. Compliance with medication is good.  Eating fairly well no sxs of hyperglycemia No palpitations HM: due dexa, mammo in January. Flu shot today  Assessment  1. Essential hypertension   2. IFG (impaired fasting glucose)   3. PAC (premature atrial contraction)   4. Asymptomatic menopausal state   5. Need for immunization against influenza      Plan   Hypertension f/u: BP control is well controlled. Continue meds.  Monitor diet.  Flu shot updated today.  Pt to schedule mammo and dexa in january  Education regarding management of these chronic disease states was given. Management strategies discussed on successive visits include dietary and exercise recommendations, goals of achieving and maintaining IBW, and lifestyle modifications aiming for adequate sleep and minimizing stressors.   Follow up: 6 mo for cpe/htn  Orders Placed This Encounter  Procedures   DG Bone Density   Flu Vaccine Trivalent High Dose (Fluad)   No orders of the defined types were placed in this encounter.     BP Readings from Last 3 Encounters:  02/02/23 114/74  07/28/22 120/70  12/24/21 110/70   Wt Readings from Last 3 Encounters:  02/02/23 147 lb 6.4 oz (66.9 kg)  07/28/22 141 lb 6.4 oz (64.1 kg)  12/24/21 143 lb 6.4 oz (65 kg)    Lab Results  Component Value Date   CHOL 154 07/28/2022   CHOL 244 (H) 12/24/2021   CHOL 251 (H) 07/06/2021   Lab Results  Component Value Date    HDL 66.40 07/28/2022   HDL 62.30 12/24/2021   HDL 61.10 07/06/2021   Lab Results  Component Value Date   LDLCALC 71 07/28/2022   LDLCALC 161 (H) 12/24/2021   LDLCALC 173 (H) 07/06/2021   Lab Results  Component Value Date   TRIG 83.0 07/28/2022   TRIG 103.0 12/24/2021   TRIG 82.0 07/06/2021   Lab Results  Component Value Date   CHOLHDL 2 07/28/2022   CHOLHDL 4 12/24/2021   CHOLHDL 4 07/06/2021   No results found for: "LDLDIRECT" Lab Results  Component Value Date   CREATININE 0.73 07/28/2022   BUN 15 07/28/2022   NA 137 07/28/2022   K 3.6 07/28/2022   CL 102 07/28/2022   CO2 25 07/28/2022    The 10-year ASCVD risk score (Arnett DK, et al., 2019) is: 5.4%   Values used to calculate the score:     Age: 65 years     Sex: Female     Is Non-Hispanic African American: No     Diabetic: No     Tobacco smoker: No     Systolic Blood Pressure: 114 mmHg     Is BP treated: Yes     HDL Cholesterol: 66.4 mg/dL     Total Cholesterol: 154 mg/dL  I reviewed the patients updated PMH, FH, and SocHx.    Patient Active Problem List   Diagnosis Date Noted   Family  history of cerebral aneurysm 07/28/2022   Family history of premature CAD father 73 07/28/2022   Family history of CVA - sister 07/28/2022   Mixed hyperlipidemia 03/07/2020   History of colon polyps 03/07/2020   PAC (premature atrial contraction) 02/07/2020   Essential hypertension 04/22/2016    Allergies: Valsartan  Social History: Patient  reports that she has never smoked. She has never used smokeless tobacco. She reports current alcohol use. She reports that she does not use drugs.  Current Meds  Medication Sig   amLODipine (NORVASC) 10 MG tablet TAKE ONE TABLET BY MOUTH DAILY   calcium carbonate (OSCAL) 1500 (600 Ca) MG TABS tablet Take by mouth every other day.   DIOVAN HCT 160-25 MG tablet Take 1 tablet by mouth daily.   rosuvastatin (CRESTOR) 5 MG tablet Take 1 tablet (5 mg total) by mouth at bedtime.     Review of Systems: Cardiovascular: negative for chest pain, palpitations, leg swelling, orthopnea Respiratory: negative for SOB, wheezing or persistent cough Gastrointestinal: negative for abdominal pain Genitourinary: negative for dysuria or gross hematuria  Objective  Vitals: BP 114/74   Pulse 63   Temp (!) 97.4 F (36.3 C)   Ht 5\' 5"  (1.651 m)   Wt 147 lb 6.4 oz (66.9 kg)   SpO2 97%   BMI 24.53 kg/m  General: no acute distress  Psych:  Alert and oriented, normal mood and affect HEENT:  Normocephalic, atraumatic, supple neck  Cardiovascular:  RRR without murmur. no edema Respiratory:  Good breath sounds bilaterally, CTAB with normal respiratory effort  Commons side effects, risks, benefits, and alternatives for medications and treatment plan prescribed today were discussed, and the patient expressed understanding of the given instructions. Patient is instructed to call or message via MyChart if he/she has any questions or concerns regarding our treatment plan. No barriers to understanding were identified. We discussed Red Flag symptoms and signs in detail. Patient expressed understanding regarding what to do in case of urgent or emergency type symptoms.  Medication list was reconciled, printed and provided to the patient in AVS. Patient instructions and summary information was reviewed with the patient as documented in the AVS. This note was prepared with assistance of Dragon voice recognition software. Occasional wrong-word or sound-a-like substitutions may have occurred due to the inherent limitation

## 2023-02-25 ENCOUNTER — Encounter (HOSPITAL_BASED_OUTPATIENT_CLINIC_OR_DEPARTMENT_OTHER): Payer: Self-pay | Admitting: Emergency Medicine

## 2023-02-25 ENCOUNTER — Other Ambulatory Visit: Payer: Self-pay

## 2023-02-25 ENCOUNTER — Emergency Department (HOSPITAL_BASED_OUTPATIENT_CLINIC_OR_DEPARTMENT_OTHER): Payer: Medicare Other

## 2023-02-25 ENCOUNTER — Encounter: Payer: Self-pay | Admitting: Family Medicine

## 2023-02-25 ENCOUNTER — Telehealth: Payer: Self-pay | Admitting: Family Medicine

## 2023-02-25 ENCOUNTER — Emergency Department (HOSPITAL_BASED_OUTPATIENT_CLINIC_OR_DEPARTMENT_OTHER): Payer: Medicare Other | Admitting: Radiology

## 2023-02-25 ENCOUNTER — Emergency Department (HOSPITAL_BASED_OUTPATIENT_CLINIC_OR_DEPARTMENT_OTHER)
Admission: EM | Admit: 2023-02-25 | Discharge: 2023-02-25 | Disposition: A | Discharge status: Home / Self Care | Payer: Medicare Other | Attending: Emergency Medicine | Admitting: Emergency Medicine

## 2023-02-25 DIAGNOSIS — R079 Chest pain, unspecified: Secondary | ICD-10-CM | POA: Insufficient documentation

## 2023-02-25 DIAGNOSIS — R0789 Other chest pain: Secondary | ICD-10-CM | POA: Diagnosis not present

## 2023-02-25 DIAGNOSIS — I10 Essential (primary) hypertension: Secondary | ICD-10-CM | POA: Insufficient documentation

## 2023-02-25 DIAGNOSIS — Z79899 Other long term (current) drug therapy: Secondary | ICD-10-CM | POA: Diagnosis not present

## 2023-02-25 DIAGNOSIS — K30 Functional dyspepsia: Secondary | ICD-10-CM | POA: Diagnosis not present

## 2023-02-25 LAB — TROPONIN I (HIGH SENSITIVITY)
Troponin I (High Sensitivity): 4 ng/L (ref <18)
Troponin I (High Sensitivity): 5 ng/L (ref <18)

## 2023-02-25 LAB — BASIC METABOLIC PANEL
Anion gap: 10 (ref 5–15)
BUN: 14 mg/dL (ref 8–23)
CO2: 23 mmol/L (ref 22–32)
Calcium: 9.7 mg/dL (ref 8.9–10.3)
Chloride: 105 mmol/L (ref 98–111)
Creatinine, Ser: 0.73 mg/dL (ref 0.44–1.00)
GFR, Estimated: >60 mL/min (ref >60)
Glucose, Bld: 114 mg/dL — ABNORMAL HIGH (ref 70–99)
Potassium: 3.4 mmol/L — ABNORMAL LOW (ref 3.5–5.1)
Sodium: 138 mmol/L (ref 135–145)

## 2023-02-25 LAB — CBC
HCT: 41.1 % (ref 36.0–46.0)
Hemoglobin: 14.3 g/dL (ref 12.0–15.0)
MCH: 31.6 pg (ref 26.0–34.0)
MCHC: 34.8 g/dL (ref 30.0–36.0)
MCV: 90.7 fL (ref 80.0–100.0)
Platelets: 247 10*3/uL (ref 150–400)
RBC: 4.53 MIL/uL (ref 3.87–5.11)
RDW: 12 % (ref 11.5–15.5)
WBC: 4.6 10*3/uL (ref 4.0–10.5)
nRBC: 0 % (ref 0.0–0.2)

## 2023-02-25 LAB — HEPATIC FUNCTION PANEL
ALT: 18 U/L (ref 0–44)
AST: 20 U/L (ref 15–41)
Albumin: 4.8 g/dL (ref 3.5–5.0)
Alkaline Phosphatase: 55 U/L (ref 38–126)
Bilirubin, Direct: 0.2 mg/dL (ref 0.0–0.2)
Indirect Bilirubin: 0.7 mg/dL (ref 0.3–0.9)
Total Bilirubin: 0.9 mg/dL (ref 0.3–1.2)
Total Protein: 8.4 g/dL — ABNORMAL HIGH (ref 6.5–8.1)

## 2023-02-25 LAB — LIPASE, BLOOD: Lipase: 21 U/L (ref 11–51)

## 2023-02-25 MED ORDER — IOHEXOL 350 MG/ML SOLN
100.0000 mL | Freq: Once | INTRAVENOUS | Status: AC | PRN
  Administered 2023-02-25: 100 mL via INTRAVENOUS

## 2023-02-25 MED ORDER — ACETAMINOPHEN 500 MG PO TABS
1000.0000 mg | ORAL_TABLET | Freq: Once | ORAL | Status: AC
  Administered 2023-02-25: 1000 mg via ORAL
  Filled 2023-02-25: qty 2

## 2023-02-25 MED ORDER — FAMOTIDINE 20 MG PO TABS
20.0000 mg | ORAL_TABLET | Freq: Once | ORAL | Status: AC
  Administered 2023-02-25: 20 mg via ORAL
  Filled 2023-02-25: qty 1

## 2023-02-25 NOTE — Discharge Instructions (Signed)
You were seen today for chest pain.  We did not see any signs of an active heart attack.  Your CT scan was reassuring as well.  This could be due to gastrointestinal symptoms.  We discussed possible admission to the hospital versus cardiology follow-up.  I placed an urgent referral for cardiology follow-up I want you to follow-up with your primary care doctor as well.  If you develop recurrent chest pain or any other new concerning symptoms you need to return to the ED.

## 2023-02-25 NOTE — Telephone Encounter (Signed)
FYI: This call has been transferred to triage nurse: the Triage Nurse. Once the result note has been entered staff can address the message at that time.  Patient called in with the following symptoms:  Red Word:chest pain   Please advise at Mobile [phone] (mobile)  Message is routed to Provider Pool.

## 2023-02-25 NOTE — ED Triage Notes (Signed)
Burping x 4 days Chest pains/ pressure into back  Recently eating [place] food when symptoms started

## 2023-02-25 NOTE — ED Provider Notes (Signed)
[facility] Provider Note   CSN: [id number] Arrival date & time: 02/25/23  1313     History {Add pertinent medical, surgical, social history, OB history to HPI:1} Chief Complaint  Patient presents with   Chest Pain    Summer Smith is a 67 y.o. female.   Chest Pain 67 year old female history of hypertension hyperlipidemia presenting for chest pain.  She states about 4 days ago she ate some [place] food which sometimes causes indigestion.  Since then she has had increased burping, some substernal burning pain.  Today she developed some pain in between her shoulder blades in her back.  She is not had any trauma, pain is not worse with moving or breathing.  No history of PE or DVT or recent travel or lower extremity edema.  She has no history of ACS or stroke.  However she was concerned because she has significant family history of cardiac disease.  No nausea vomiting diaphoresis.  No prior heart attack or stroke as far she knows.  She does not smoke.  She has no abdominal pain at all even with eating.     Home Medications Prior to Admission medications   Medication Sig Start Date End Date Taking? Authorizing Provider  amLODipine (NORVASC) 10 MG tablet TAKE ONE TABLET BY MOUTH DAILY 03/29/22   Willow Ora, MD  calcium carbonate (OSCAL) 1500 (600 Ca) MG TABS tablet Take by mouth every other day.    [provider]  DIOVAN HCT 160-25 MG tablet Take 1 tablet by mouth daily. 07/28/22   Willow Ora, MD  rosuvastatin (CRESTOR) 5 MG tablet Take 1 tablet (5 mg total) by mouth at bedtime. 06/25/22   Willow Ora, MD      Allergies    Valsartan    Review of Systems   Review of Systems  Cardiovascular:  Positive for chest pain.  Review of systems completed and notable as per HPI.  ROS otherwise negative.   Physical Exam Updated Vital Signs BP (!) 144/92 (BP Location: Left Arm)   Pulse 78   Temp 98.1 F (36.7 C)   Resp 18    SpO2 99%  Physical Exam Vitals and nursing note reviewed.  Constitutional:      General: She is not in acute distress.    Appearance: She is well-developed.  HENT:     Head: Normocephalic and atraumatic.  Eyes:     Conjunctiva/sclera: Conjunctivae normal.  Cardiovascular:     Rate and Rhythm: Normal rate and regular rhythm.     Pulses: Normal pulses.     Heart sounds: Normal heart sounds. No murmur heard. Pulmonary:     Effort: Pulmonary effort is normal. No respiratory distress.     Breath sounds: Normal breath sounds.  Abdominal:     Palpations: Abdomen is soft.     Tenderness: There is no abdominal tenderness. There is no guarding or rebound.  Musculoskeletal:        General: No swelling.     Cervical back: Neck supple.     Right lower leg: No edema.     Left lower leg: No edema.     Comments: No spinal tenderness  Skin:    General: Skin is warm and dry.     Capillary Refill: Capillary refill takes less than 2 seconds.  Neurological:     General: No focal deficit present.     Mental Status: She is alert and oriented to person, place,  and time. Mental status is at baseline.  Psychiatric:        Mood and Affect: Mood normal.     ED Results / Procedures / Treatments   Labs (all labs ordered are listed, but only abnormal results are displayed) Labs Reviewed  BASIC METABOLIC PANEL - Abnormal; Notable for the following components:      Result Value   Potassium 3.4 (*)    Glucose, Bld 114 (*)    All other components within normal limits  CBC  TROPONIN I (HIGH SENSITIVITY)  TROPONIN I (HIGH SENSITIVITY)    EKG None  Radiology No results found.  Procedures Procedures  {Document cardiac monitor, telemetry assessment procedure when appropriate:1}  Medications Ordered in ED Medications - No data to display  ED Course/ Medical Decision Making/ A&P   {   Click here for ABCD2, HEART and other calculatorsREFRESH Note before signing :1}                               Medical Decision Making Amount and/or Complexity of Data Reviewed Labs: ordered. Radiology: ordered.  Risk OTC drugs.   Medical Decision Making:   Summer Smith is a 67 y.o. female who presented to the ED today with 67 year old female presenting for chest pain.  EKG shows some nonspecific T wave abnormality, no STEMI.  No prior to compare to.  Pain is atypical, nonexertional.  Hear score of 4, initial troponin is unremarkable.  Will obtain delta.  She does report some new back pain associated with this chest pain, seems less consistent with dissection but given this new finding will obtain dissection study to rule out aortic abnormality.  I do wonder if she could have some indigestion given increased burping causing her pain.  Her abdominal exam is completely benign, I do not think she has cholelithiasis or cholecystitis causing her symptoms.   {crccomplexity:27900} Reviewed and confirmed nursing documentation for past medical history, family history, social history.  Reassessment and Plan:   ***    Patient's presentation is most consistent with {EM COPA:27473}     {Document critical care time when appropriate:1} {Document review of labs and clinical decision tools ie heart score, Chads2Vasc2 etc:1}  {Document your independent review of radiology images, and any outside records:1} {Document your discussion with family members, caretakers, and with consultants:1} {Document social determinants of health affecting pt's care:1} {Document your decision making why or why not admission, treatments were needed:1} Final Clinical Impression(s) / ED Diagnoses Final diagnoses:  None    Rx / DC Orders ED Discharge Orders     None

## 2023-02-26 ENCOUNTER — Encounter: Payer: Self-pay | Admitting: Family Medicine

## 2023-02-26 DIAGNOSIS — R079 Chest pain, unspecified: Secondary | ICD-10-CM

## 2023-02-28 NOTE — Telephone Encounter (Signed)
Noted  

## 2023-02-28 NOTE — Telephone Encounter (Signed)
Was seen in [facility] on 02/25/23. Advised follow up by provider.   Patient Name First: Summer Last: Smith Gender: Female DOB: [birth year] Age: 67 Y 3 M 4 D Return Phone Number: [phone] (Primary) Address: City/ State/ Zip: [name]  [address] Client [facility] - Administrator, sports at [facility] Provider Asencion Partridge- MD Contact Type Call Who Is Calling Patient / Member / Family / Caregiver Call Type Triage / Clinical Relationship To Patient Self Return Phone Number [phone] (Primary) Chief Complaint CHEST PAIN - pain, pressure, heaviness or tightness Reason for Call Symptomatic / Request for Health Information Initial Comment Caller states she has been having chest pain on and off. She also states she has back pain. She has been burping a lot as well. Translation No Nurse Assessment Nurse: Para March, RN, Jasmine Date/Time (Eastern Time): 02/25/2023 12:28:54 PM Confirm and document reason for call. If symptomatic, describe symptoms. ---Caller states she has been having chest pain on and off since Tuesday. She also states she has back pain. She has been burping a lot as well. states having a family hx of cardiac issues. States starting after eating [place] food on Tuesday Does the patient have any new or worsening symptoms? ---Yes Will a triage be completed? ---Yes Related visit to physician within the last 2 weeks? ---No Does the PT have any chronic conditions? (i.e. diabetes, asthma, this includes High risk factors for pregnancy, etc.) ---Yes List chronic conditions. ---HTN Cholesterol Is this a behavioral health or substance abuse call? ---No Guidelines Guideline Title Affirmed Question Affirmed Notes Nurse Date/Time Lamount Cohen Time) Chest Pain Pain also in shoulder(s) or arm(s) or jaw (Exception: Para March, RN, [facility] 02/25/2023 12:31:26 PM Guidelines Guideline Title Affirmed Question Affirmed  Notes Nurse Date/Time (Eastern Time) Pain is clearly made worse by movement.) Disp. Time Lamount Cohen Time) Disposition Final User 02/25/2023 12:26:54 PM Send to Urgent Felisa Bonier 02/25/2023 12:36:53 PM Go to ED Now Yes Para March, RN, [facility] Final Disposition 02/25/2023 12:36:53 PM Go to ED Now Yes Para March, RN, Carolin [facility]ernsey Disagree/Comply Comply Caller Understands Yes PreDisposition Call Doctor Care Advice Given Per Guideline GO TO ED NOW: * You need to be seen in the Emergency Department. * Go to the ED at ___________ Hospital. * Leave now. Drive carefully. Comments User: Charisse March, RN Date/Time Lamount Cohen Time): 02/25/2023 12:37:46 PM States having right shoulder pain that has been radiating to the back. States CP is 1-2 and doesn't increase but has been on and off since Tuesday and then Shoulder and back pain started Wednesday Referrals GO TO FACILITY UNDECIDED

## 2023-03-20 ENCOUNTER — Other Ambulatory Visit: Payer: Self-pay | Admitting: Family Medicine

## 2023-04-25 ENCOUNTER — Telehealth: Payer: Self-pay | Admitting: Family Medicine

## 2023-04-25 ENCOUNTER — Other Ambulatory Visit: Payer: Self-pay

## 2023-04-25 MED ORDER — DIOVAN HCT 160-25 MG PO TABS: 1.0000 | ORAL_TABLET | Freq: Every day | ORAL | 3 refills | Status: DC

## 2023-04-25 MED ORDER — DIOVAN HCT 160-25 MG PO TABS: 1.0000 | ORAL_TABLET | Freq: Every day | ORAL | 3 refills | Status: AC

## 2023-04-25 NOTE — Telephone Encounter (Signed)
Pt states she is needing to have RX refill for 79yr & this needs to be faxed to [phone].  Or sent to patient and she can email to the pharmacy.   RX: DIOVAN HCT 160-25 MG tablet

## 2023-04-26 ENCOUNTER — Ambulatory Visit (INDEPENDENT_AMBULATORY_CARE_PROVIDER_SITE_OTHER): Payer: Medicare Other

## 2023-04-26 VITALS — Wt 144.0 lb

## 2023-04-26 DIAGNOSIS — Z0000 Encounter for general adult medical examination without abnormal findings: Secondary | ICD-10-CM

## 2023-04-26 NOTE — Patient Instructions (Signed)
Summer Smith , Thank you for taking time to come for your Medicare Wellness Visit. I appreciate your ongoing commitment to your health goals. Please review the following plan we discussed and let me know if I can assist you in the future.   Referrals/Orders/Follow-Ups/Clinician Recommendations: Aim for 30 minutes of exercise or brisk walking, 6-8 glasses of water, and 5 servings of fruits and vegetables each day.   This is a list of the screening recommended for you and due dates:  Health Maintenance  Topic Date Due   COVID-19 Vaccine (3 - 2024-25 season) 01/09/2023   DEXA scan (bone density measurement)  06/11/2023*   Mammogram  05/22/2023   Medicare Annual Wellness Visit  04/25/2024   DTaP/Tdap/Td vaccine (4 - Td or Tdap) 09/29/2027   Colon Cancer Screening  08/09/2030   Pneumonia Vaccine  Completed   Flu Shot  Completed   Hepatitis C Screening  Completed   Zoster (Shingles) Vaccine  Completed   HPV Vaccine  Aged Out  *Topic was postponed. The date shown is not the original due date.    Advanced directives: (Copy Requested) Please bring a copy of your health care power of attorney and living will to the office to be added to your chart at your convenience.  Next Medicare Annual Wellness Visit scheduled for next year: No pt declined at this time request to follow up at a later date

## 2023-04-26 NOTE — Progress Notes (Signed)
Subjective:   Summer Smith is a 67 y.o. female who presents for Medicare Annual (Subsequent) preventive examination.  Visit Complete: Virtual I connected with  Adonis Huguenin on 04/26/23 by a audio enabled telemedicine application and verified that I am speaking with the correct person using two identifiers.  Patient Location: Home  Provider Location: Office/Clinic  I discussed the limitations of evaluation and management by telemedicine. The patient expressed understanding and agreed to proceed.  Vital Signs: Because this visit was a virtual/telehealth visit, some criteria may be missing or patient reported. Any vitals not documented were not able to be obtained and vitals that have been documented are patient reported.  Cardiac Risk Factors include: advanced age (>58men, >24 women);dyslipidemia;hypertension     Objective:    Today's Vitals   04/26/23 1527  Weight: 144 lb (65.3 kg)   Body mass index is 23.96 kg/m.     04/26/2023    3:36 PM 02/25/2023    1:26 PM 01/31/2020    8:18 PM  Advanced Directives  Does Patient Have a Medical Advance Directive? Yes No No  Type of Estate agent of [place];Living will    Copy of Healthcare Power of Attorney in Chart? No - copy requested    Would patient like information on creating a medical advance directive?  No - Patient declined No - Patient declined    Current Medications (verified) Outpatient Encounter Medications as of 04/26/2023  Medication Sig   amLODipine (NORVASC) 10 MG tablet TAKE 1 TABLET BY MOUTH DAILY   calcium carbonate (OSCAL) 1500 (600 Ca) MG TABS tablet Take by mouth every other day.   DIOVAN HCT 160-25 MG tablet Take 1 tablet by mouth daily.   rosuvastatin (CRESTOR) 5 MG tablet Take 1 tablet (5 mg total) by mouth at bedtime.   No facility-administered encounter medications on file as of 04/26/2023.    Allergies (verified) Valsartan   History: Past Medical History:  Diagnosis Date    Dyslipidemia 03/07/2020   Hypertension    History reviewed. No pertinent surgical history. Family History  Problem Relation Age of Onset   Arthritis Mother    Diabetes Mother    Cerebral aneurysm Mother    Heart disease Father 57   Hypertension Father    Diabetes Sister    Stroke Sister    Cerebral aneurysm Brother    Diabetes Brother    Hypertension Paternal Grandmother    Hypertension Paternal Grandfather    COPD Neg Hx    Social History   Socioeconomic History   Marital status: Married    Spouse name: Not on file   Number of children: 2   Years of education: Not on file   Highest education level: Not on file  Occupational History   Occupation: Engineer, drilling: [employer]   Occupation: former Architect  Tobacco Use   Smoking status: Never   Smokeless tobacco: Never  Substance and Sexual Activity   Alcohol use: Yes    Alcohol/week: 0.0 standard drinks of alcohol   Drug use: Never   Sexual activity: Yes    Birth control/protection: Post-menopausal  Other Topics Concern   Not on file  Social History Narrative   Originally from [place]; [place]. Married. 2 grown sons: MD and Technical sales engineer. Happy. Has a black belt   Social Drivers of Corporate investment banker Strain: Low Risk  (04/26/2023)   Overall Financial Resource Strain (CARDIA)  Difficulty of Paying Living Expenses: Not hard at all  Food Insecurity: No Food Insecurity (04/26/2023)   Hunger Vital Sign    Worried About Running Out of Food in the Last Year: Never true    Ran Out of Food in the Last Year: Never true  Transportation Needs: No Transportation Needs (04/26/2023)   PRAPARE - Administrator, Civil Service (Medical): No    Lack of Transportation (Non-Medical): No  Physical Activity: Sufficiently Active (04/26/2023)   Exercise Vital Sign    Days of Exercise per Week: 4 days    Minutes of Exercise per Session: 60 min  Stress: No Stress Concern  Present (04/26/2023)   [employer] of Occupational Health - Occupational Stress Questionnaire    Feeling of Stress : Not at all  Social Connections: Moderately Integrated (04/26/2023)   Social Connection and Isolation Panel [NHANES]    Frequency of Communication with Friends and Family: More than three times a week    Frequency of Social Gatherings with Friends and Family: More than three times a week    Attends Religious Services: More than 4 times per year    Active Member of [employer] or Organizations: No    Attends Engineer, structural: Never    Marital Status: Married    Tobacco Counseling Counseling given: Not Answered   Clinical Intake:  Pre-visit preparation completed: Yes  Pain : No/denies pain     BMI - recorded: 23.96 Nutritional Status: BMI of 19-24  Normal Nutritional Risks: None Diabetes: No  How often do you need to have someone help you when you read instructions, pamphlets, or other written materials from your doctor or pharmacy?: 1 - Never  Interpreter Needed?: No  Information entered by :: Lanier Ensign, LPN   Activities of Daily Living    04/26/2023    3:28 PM  In your present state of health, do you have any difficulty performing the following activities:  Hearing? 0  Vision? 0  Difficulty concentrating or making decisions? 0  Walking or climbing stairs? 0  Dressing or bathing? 0  Doing errands, shopping? 0  Preparing Food and eating ? N  Using the Toilet? N  In the past six months, have you accidently leaked urine? N  Do you have problems with loss of bowel control? N  Managing your Medications? N  Managing your Finances? N  Housekeeping or managing your Housekeeping? N    Patient Care Team: Willow Ora, MD as PCP - General (Family Medicine) Bernette Redbird, MD as Consulting Physician (Gastroenterology)  Indicate any recent Medical Services you may have received from other than [facility] providers in the past year (date may  be approximate).     Assessment:   This is a routine wellness examination for Summer Smith.  Hearing/Vision screen Hearing Screening - Comments:: Pt denies any hearing issues  Vision Screening - Comments:: Pt follows up with eye provider [place] [facility] for exams    Goals Addressed             This Visit's Progress    Patient Stated       More increased exercise        Depression Screen    04/26/2023    3:35 PM 07/28/2022    1:02 PM 12/24/2021    9:11 AM 07/06/2021   10:55 AM 06/26/2020   11:11 AM 03/07/2020    9:47 AM  PHQ 2/9 Scores  PHQ - 2 Score 0 0 0 0 0 0  Fall Risk    04/26/2023    3:36 PM 07/28/2022    1:01 PM 12/24/2021    9:11 AM 10/31/2015    8:02 AM  Fall Risk   Falls in the past year? 0 0 0 No  Number falls in past yr: 0 0 0   Injury with Fall? 0 0 0   Risk for fall due to : No Fall Risks No Fall Risks No Fall Risks   Follow up Falls prevention discussed Falls evaluation completed Falls evaluation completed     MEDICARE RISK AT HOME: Medicare Risk at Home Any stairs in or around the home?: Yes If so, are there any without handrails?: No Home free of loose throw rugs in walkways, pet beds, electrical cords, etc?: Yes Adequate lighting in your home to reduce risk of falls?: Yes Life alert?: No Use of a cane, walker or w/c?: No Grab bars in the bathroom?: No Shower chair or bench in shower?: No Elevated toilet seat or a handicapped toilet?: No  TIMED UP AND GO:  Was the test performed?  No    Cognitive Function:        04/26/2023    3:37 PM 12/24/2021    9:18 AM  6CIT Screen  What Year? 0 points 0 points  What month? 0 points 0 points  What time? 0 points 0 points  Count back from 20 0 points 0 points  Months in reverse 0 points 0 points  Repeat phrase 0 points 0 points  Total Score 0 points 0 points    Immunizations Immunization History  Administered Date(s) Administered   Fluad Quad(high Dose 65+) 07/28/2022   Fluad Trivalent(High Dose  65+) 02/02/2023   Influenza,inj,Quad PF,6+ Mos 02/06/2017, 02/01/2019, 02/07/2020   Influenza-Unspecified 02/16/2016, 03/01/2018   PFIZER(Purple Top)SARS-COV-2 Vaccination 07/25/2019, 08/15/2019   PNEUMOCOCCAL CONJUGATE-20 12/24/2021   Td 03/10/2005   Tdap 12/30/2010, 09/28/2017   Zoster Recombinant(Shingrix) 06/26/2020, 12/24/2020    TDAP status: Up to date  Flu Vaccine status: Up to date  Pneumococcal vaccine status: Up to date  Covid-19 vaccine status: Information provided on how to obtain vaccines.   Qualifies for Shingles Vaccine? Yes   Zostavax completed Yes   Shingrix Completed?: Yes  Screening Tests Health Maintenance  Topic Date Due   COVID-19 Vaccine (3 - 2024-25 season) 01/09/2023   DEXA SCAN  06/11/2023 (Originally 11/20/2021)   MAMMOGRAM  05/22/2023   Medicare Annual Wellness (AWV)  04/25/2024   DTaP/Tdap/Td (4 - Td or Tdap) 09/29/2027   Colonoscopy  08/09/2030   Pneumonia Vaccine 66+ Years old  Completed   INFLUENZA VACCINE  Completed   Hepatitis C Screening  Completed   Zoster Vaccines- Shingrix  Completed   HPV VACCINES  Aged Out    Health Maintenance  Health Maintenance Due  Topic Date Due   COVID-19 Vaccine (3 - 2024-25 season) 01/09/2023    Colorectal cancer screening: Type of screening: Colonoscopy. Completed 08/08/20. Repeat every 10 years  Mammogram status: Completed 05/21/22. Repeat every year  Bone Density status: Ordered 02/02/23. Pt provided with contact info and advised to call to schedule appt.    Additional Screening:  Hepatitis C Screening:  Completed 04/22/16  Vision Screening: Recommended annual ophthalmology exams for early detection of glaucoma and other disorders of the eye. Is the patient up to date with their annual eye exam?  No  Who is the provider or what is the name of the office in which the patient attends annual eye exams? [place] [facility]  If pt is not established with a provider, would they like to be  referred to a provider to establish care? No .   Dental Screening: Recommended annual dental exams for proper oral hygiene   Community Resource Referral / Chronic Care Management: CRR required this visit?  No   CCM required this visit?  No     Plan:     I have personally reviewed and noted the following in the patient's chart:   Medical and social history Use of alcohol, tobacco or illicit drugs  Current medications and supplements including opioid prescriptions. Patient is not currently taking opioid prescriptions. Functional ability and status Nutritional status Physical activity Advanced directives List of other physicians Hospitalizations, surgeries, and ER visits in previous 12 months Vitals Screenings to include cognitive, depression, and falls Referrals and appointments  In addition, I have reviewed and discussed with patient certain preventive protocols, quality metrics, and best practice recommendations. A written personalized care plan for preventive services as well as general preventive health recommendations were provided to patient.     Marzella Schlein, LPN   [id number]   After Visit Summary: (MyChart) Due to this being a telephonic visit, the after visit summary with patients personalized plan was offered to patient via MyChart   Nurse Notes: none

## 2023-06-08 DIAGNOSIS — Z1231 Encounter for screening mammogram for malignant neoplasm of breast: Secondary | ICD-10-CM | POA: Diagnosis not present

## 2023-06-08 LAB — HM MAMMOGRAPHY

## 2023-06-09 ENCOUNTER — Encounter: Payer: Self-pay | Admitting: Family Medicine

## 2023-06-24 ENCOUNTER — Other Ambulatory Visit: Payer: Self-pay | Admitting: Family Medicine

## 2023-09-14 DIAGNOSIS — K080 Exfoliation of teeth due to systemic causes: Secondary | ICD-10-CM | POA: Diagnosis not present

## 2023-12-27 ENCOUNTER — Encounter: Admitting: Family Medicine

## 2024-01-17 ENCOUNTER — Encounter: Payer: Self-pay | Admitting: Family Medicine

## 2024-01-17 ENCOUNTER — Ambulatory Visit: Admitting: Family Medicine

## 2024-01-17 VITALS — BP 128/78 | HR 73 | Temp 98.4°F | Ht 65.0 in | Wt 144.6 lb

## 2024-01-17 DIAGNOSIS — Z23 Encounter for immunization: Secondary | ICD-10-CM

## 2024-01-17 DIAGNOSIS — Z860100 Personal history of colon polyps, unspecified: Secondary | ICD-10-CM | POA: Diagnosis not present

## 2024-01-17 DIAGNOSIS — R7301 Impaired fasting glucose: Secondary | ICD-10-CM

## 2024-01-17 DIAGNOSIS — Z780 Asymptomatic menopausal state: Secondary | ICD-10-CM | POA: Diagnosis not present

## 2024-01-17 DIAGNOSIS — E782 Mixed hyperlipidemia: Secondary | ICD-10-CM | POA: Diagnosis not present

## 2024-01-17 DIAGNOSIS — Z0000 Encounter for general adult medical examination without abnormal findings: Secondary | ICD-10-CM | POA: Diagnosis not present

## 2024-01-17 DIAGNOSIS — I10 Essential (primary) hypertension: Secondary | ICD-10-CM | POA: Diagnosis not present

## 2024-01-17 DIAGNOSIS — Z0001 Encounter for general adult medical examination with abnormal findings: Secondary | ICD-10-CM

## 2024-01-17 LAB — COMPREHENSIVE METABOLIC PANEL WITH GFR
ALT: 14 U/L (ref 0–35)
AST: 15 U/L (ref 0–37)
Albumin: 4.9 g/dL (ref 3.5–5.2)
Alkaline Phosphatase: 52 U/L (ref 39–117)
BUN: 14 mg/dL (ref 6–23)
CO2: 28 meq/L (ref 19–32)
Calcium: 10.1 mg/dL (ref 8.4–10.5)
Chloride: 104 meq/L (ref 96–112)
Creatinine, Ser: 0.8 mg/dL (ref 0.40–1.20)
GFR: 76.38 mL/min (ref >60.00)
Glucose, Bld: 121 mg/dL — ABNORMAL HIGH (ref 70–99)
Potassium: 4.1 meq/L (ref 3.5–5.1)
Sodium: 140 meq/L (ref 135–145)
Total Bilirubin: 0.6 mg/dL (ref 0.2–1.2)
Total Protein: 8.4 g/dL — ABNORMAL HIGH (ref 6.0–8.3)

## 2024-01-17 LAB — CBC WITH DIFFERENTIAL/PLATELET
Basophils Absolute: 0 K/uL (ref 0.0–0.1)
Basophils Relative: 0.9 % (ref 0.0–3.0)
Eosinophils Absolute: 0.1 K/uL (ref 0.0–0.7)
Eosinophils Relative: 2.4 % (ref 0.0–5.0)
HCT: 43.1 % (ref 36.0–46.0)
Hemoglobin: 14.4 g/dL (ref 12.0–15.0)
Lymphocytes Relative: 43.8 % (ref 12.0–46.0)
Lymphs Abs: 2.2 K/uL (ref 0.7–4.0)
MCHC: 33.4 g/dL (ref 30.0–36.0)
MCV: 94.4 fl (ref 78.0–100.0)
Monocytes Absolute: 0.3 K/uL (ref 0.1–1.0)
Monocytes Relative: 5.1 % (ref 3.0–12.0)
Neutro Abs: 2.4 K/uL (ref 1.4–7.7)
Neutrophils Relative %: 47.8 % (ref 43.0–77.0)
Platelets: 250 K/uL (ref 150.0–400.0)
RBC: 4.57 Mil/uL (ref 3.87–5.11)
RDW: 13 % (ref 11.5–15.5)
WBC: 5 K/uL (ref 4.0–10.5)

## 2024-01-17 LAB — LIPID PANEL
Cholesterol: 137 mg/dL (ref 0–200)
HDL: 61.3 mg/dL (ref >39.00)
LDL Cholesterol: 61 mg/dL (ref 0–99)
NonHDL: 75.93
Total CHOL/HDL Ratio: 2
Triglycerides: 77 mg/dL (ref 0.0–149.0)
VLDL: 15.4 mg/dL (ref 0.0–40.0)

## 2024-01-17 LAB — TSH: TSH: 1.07 u[IU]/mL (ref 0.35–5.50)

## 2024-01-17 LAB — HEMOGLOBIN A1C: Hgb A1c MFr Bld: 6.2 % (ref 4.6–6.5)

## 2024-01-17 NOTE — Progress Notes (Signed)
 Subjective  Chief Complaint  Patient presents with   Annual Exam    Pt here for Annual Exam and is currently fasting. DEXA has not bee scheduled   Hypertension   Hyperlipidemia    HPI: Summer Smith is a 68 y.o. female who presents to [facility] [place] today for a Female Wellness Visit. She also has the concerns and/or needs as listed above in the chief complaint. These will be addressed in addition to the Health Maintenance Visit.   Wellness Visit: annual visit with health maintenance review and exam  HM: mammo current. Needs bone density. CRC screen current. Eligible for flu vaccine. Other imms up to date. Walks almost daily. Eats healthy. Feels great Chronic disease f/u and/or acute problem visit: (deemed necessary to be done in addition to the wellness visit): HTN: home readings typically normal although can get a diastolic in the 80s. Nl in office today. No cp or sob. Had ER visit last year w/ neg cardiac eval.  HLD on rosuvastatin . Tolerates well. Fasting.  H/o IFG w/o sxs of hyperglycemia.   Assessment  1. Encounter for well adult exam with abnormal findings   2. Need for influenza vaccination   3. Essential hypertension   4. History of colon polyps   5. Mixed hyperlipidemia   6. Asymptomatic menopausal state   7. IFG (impaired fasting glucose)      Plan  Female Wellness Visit: Age appropriate Health Maintenance and Prevention measures were discussed with patient. Included topics are cancer screening recommendations, ways to keep healthy (see AVS) including dietary and exercise recommendations, regular eye and dental care, use of seat belts, and avoidance of moderate alcohol use and tobacco use. Pt to schedule dexa w/ mammo for next year.  BMI: discussed patient's BMI and encouraged positive lifestyle modifications to help get to or maintain a target BMI. HM needs and immunizations were addressed and ordered. See below for orders. See HM and immunization  section for updates. Flu vaccine today Routine labs and screening tests ordered including cmp, cbc and lipids where appropriate. Discussed recommendations regarding Vit D and calcium  supplementation (see AVS)  Chronic disease management visit and/or acute problem visit: HTN is controlled. Rec  home monitoring. Continue amlodipine  and diovan  hct. Check renal function and electrolytes. Check lipids and lfts on statin.  Check A1c.  Continue healthy lifestyle.   Follow up: 12 mo for cpe and HTN/HLD  Orders Placed This Encounter  Procedures   DG Bone Density   Flu vaccine HIGH DOSE PF(Fluzone Trivalent)   CBC with Differential/Platelet   Comprehensive metabolic panel with GFR   Lipid panel   Hemoglobin A1c   TSH   No orders of the defined types were placed in this encounter.     Body mass index is 24.06 kg/m. Wt Readings from Last 3 Encounters:  01/17/24 144 lb 9.6 oz (65.6 kg)  04/26/23 144 lb (65.3 kg)  02/02/23 147 lb 6.4 oz (66.9 kg)   BP Readings from Last 3 Encounters:  01/17/24 128/78  02/25/23 131/86  02/02/23 114/74     Patient Active Problem List   Diagnosis Date Noted   IFG (impaired fasting glucose) 01/17/2024   Family history of cerebral aneurysm 07/28/2022    Mom and brother    Family history of premature CAD father 35 07/28/2022   Family history of CVA - sister 07/28/2022   Mixed hyperlipidemia 03/07/2020   History of colon polyps 03/07/2020    But normal most  recent colonoscopy, dr donnald, returned to q 10 year screens.     PAC (premature atrial contraction) 02/07/2020    [facility] cards evaluation for sxs of palpitations 01/2020: nl Echocardiogram. EKG with PACs. zio patch mild PAC. Nl labs.? Stress induced. Zio patch: Ambulatory ECG monitoring was performed from 03/06/20 to 03/20/20.  - The predominant rhythm was sinus rhythm, with the rate ranging from 44 to 143 and averaging 71 bpm when in sinus rhythm  - Rare supraventricular ectopics (PACs) were  recorded with 4 episodes of SVT, longest lasting 10 beats at 115 bpm  - Rare ventricular ectopics (PVCs) were recorded with no recorded episodes of wide-complex tachycardia  - Patient-initiated recordings/events revealed sinus rhythm, PVCs, and artifact  - No pauses >3 seconds or high-grade AV block detected  - No atrial fibrillation detected  Echocardiogram: Summary   1. The left ventricle is normal in size with normal wall thickness.    2. The left ventricular systolic function is normal, LVEF is visually  estimated at > 55%.    3. The right ventricle is normal in size, with normal systolic function.     Essential hypertension 04/22/2016   Health Maintenance  Topic Date Due   DEXA SCAN  Never done   Influenza Vaccine  12/09/2023   COVID-19 Vaccine (3 - 2025-26 season) 02/02/2024 (Originally 01/09/2024)   Medicare Annual Wellness (AWV)  04/25/2024   MAMMOGRAM  06/07/2024   DTaP/Tdap/Td (4 - Td or Tdap) 09/29/2027   Colonoscopy  08/09/2030   Pneumococcal Vaccine: 50+ Years  Completed   Hepatitis C Screening  Completed   Zoster Vaccines- Shingrix   Completed   HPV VACCINES  Aged Out   Meningococcal B Vaccine  Aged Out   Immunization History  Administered Date(s) Administered   Fluad Quad(high Dose 65+) 07/28/2022   Fluad Trivalent(High Dose 65+) 02/02/2023   Influenza,inj,Quad PF,6+ Mos 02/06/2017, 02/01/2019, 02/07/2020   Influenza-Unspecified 02/16/2016, 03/01/2018   PFIZER(Purple Top)SARS-COV-2 Vaccination 07/25/2019, 08/15/2019   PNEUMOCOCCAL CONJUGATE-20 12/24/2021   Td 03/10/2005   Tdap 12/30/2010, 09/28/2017   Zoster Recombinant(Shingrix ) 06/26/2020, 12/24/2020   We updated and reviewed the patient's past history in detail and it is documented below. Allergies: Patient is allergic to valsartan . Past Medical History Patient  has a past medical history of Dyslipidemia (03/07/2020) and Hypertension. Past Surgical History Patient  has no past surgical history on  file. Family History: Patient family history includes Arthritis in her mother; Cerebral aneurysm in her brother and mother; Diabetes in her brother, mother, and sister; Heart disease (age of onset: 27) in her father; Hypertension in her father, paternal grandfather, and paternal grandmother; Stroke in her sister. Social History:  Patient  reports that she has never smoked. She has never used smokeless tobacco. She reports current alcohol use. She reports that she does not use drugs.  Review of Systems: Constitutional: negative for fever or malaise Ophthalmic: negative for photophobia, double vision or loss of vision Cardiovascular: negative for chest pain, dyspnea on exertion, or new LE swelling Respiratory: negative for SOB or persistent cough Gastrointestinal: negative for abdominal pain, change in bowel habits or melena Genitourinary: negative for dysuria or gross hematuria, no abnormal uterine bleeding or disharge Musculoskeletal: negative for new gait disturbance or muscular weakness Integumentary: negative for new or persistent rashes, no breast lumps Neurological: negative for TIA or stroke symptoms Psychiatric: negative for SI or delusions Allergic/Immunologic: negative for hives  Patient Care Team    Relationship Specialty Notifications Start End  Jodie Lavern CROME, MD  PCP - General Family Medicine  03/07/20   Donnald Charleston, MD Consulting Physician Gastroenterology  08/08/19     Objective  Vitals: BP 128/78 (BP Location: Right Arm, Patient Position: Supine, Cuff Size: Normal)   Pulse 73   Temp 98.4 F (36.9 C)   Ht 5' 5 (1.651 m)   Wt 144 lb 9.6 oz (65.6 kg)   SpO2 97%   BMI 24.06 kg/m  General:  Well developed, well nourished, no acute distress , looks great Psych:  Alert and orientedx3,normal mood and affect HEENT:  Normocephalic, atraumatic, non-icteric sclera,  supple neck without adenopathy, mass or thyromegaly Cardiovascular:  Normal S1, S2, RRR without gallop,  rub or murmur Respiratory:  Good breath sounds bilaterally, CTAB with normal respiratory effort Gastrointestinal: normal bowel sounds, soft, non-tender, no noted masses. No HSM MSK: extremities without edema, joints without erythema or swelling Neurologic:    Mental status is normal.  Gross motor and sensory exams are normal.  No tremor  Commons side effects, risks, benefits, and alternatives for medications and treatment plan prescribed today were discussed, and the patient expressed understanding of the given instructions. Patient is instructed to call or message via MyChart if he/she has any questions or concerns regarding our treatment plan. No barriers to understanding were identified. We discussed Red Flag symptoms and signs in detail. Patient expressed understanding regarding what to do in case of urgent or emergency type symptoms.  Medication list was reconciled, printed and provided to the patient in AVS. Patient instructions and summary information was reviewed with the patient as documented in the AVS. This note was prepared with assistance of Dragon voice recognition software. Occasional wrong-word or sound-a-like substitutions may have occurred due to the inherent limitations of voice recognition software

## 2024-01-17 NOTE — Patient Instructions (Signed)
 Please return in 12 months for your annual complete physical; please come fasting. For follow up on chronic medical conditions   I will release your lab results to you on your MyChart account with further instructions. You may see the results before I do, but when I review them I will send you a message with my report or have my assistant call you if things need to be discussed. Please reply to my message with any questions. Thank you!   If you have any questions or concerns, please don't hesitate to send me a message via MyChart or call the office at [phone]. Thank you for visiting with us  today! It's our pleasure caring for you.   Glad you are well!

## 2024-01-20 ENCOUNTER — Ambulatory Visit: Payer: Self-pay | Admitting: Family

## 2024-03-21 ENCOUNTER — Encounter: Payer: Self-pay | Admitting: Family Medicine

## 2024-03-21 DIAGNOSIS — K080 Exfoliation of teeth due to systemic causes: Secondary | ICD-10-CM | POA: Diagnosis not present

## 2024-03-23 ENCOUNTER — Telehealth: Payer: Self-pay

## 2024-03-23 ENCOUNTER — Other Ambulatory Visit: Payer: Self-pay

## 2024-03-23 ENCOUNTER — Other Ambulatory Visit: Payer: Self-pay | Admitting: *Deleted

## 2024-03-23 NOTE — Telephone Encounter (Signed)
 Copied from CRM #[id number]. Topic: Clinical - Medication Question >> Mar 22, 2024 12:13 PM Summer Smith wrote: Reason for CRM: Pt is requesting to speak with Avelina in regards to the DIOVAN  HCT 160-25 MG tablet. Pt would like a callback today if possible.  Wrote script for Diovan  160-25 90/3 refills and faxed it to [facility] [phone]

## 2024-03-28 ENCOUNTER — Other Ambulatory Visit: Payer: Self-pay | Admitting: Family Medicine

## 2024-04-13 ENCOUNTER — Encounter: Payer: Self-pay | Admitting: Family Medicine

## 2024-05-21 ENCOUNTER — Telehealth: Payer: Self-pay

## 2024-05-21 ENCOUNTER — Other Ambulatory Visit: Payer: Self-pay

## 2024-05-21 DIAGNOSIS — Z13820 Encounter for screening for osteoporosis: Secondary | ICD-10-CM

## 2024-05-21 NOTE — Telephone Encounter (Signed)
 Bone Density Scan order placed and faxed to [facility].     Copied from CRM [id number]. Topic: Clinical - Request for Lab/Test Order >> May 21, 2024 10:17 AM Summer Smith wrote: Reason for CRM: Pt is trying to schedule a bone density exam with [facility] and they told her an order from her PCP is needed. Please let her know via mychart message once complete.

## 2025-01-17 ENCOUNTER — Encounter: Admitting: Family Medicine
